# Patient Record
Sex: Male | Born: 1935 | Race: White | Hispanic: No | Marital: Married | Smoking: Former smoker
Health system: Southern US, Community
[De-identification: ages and names within clinical notes are randomized; demographics above are authoritative.]

## PROBLEM LIST (undated history)

## (undated) DIAGNOSIS — F329 Major depressive disorder, single episode, unspecified: Secondary | ICD-10-CM

## (undated) DIAGNOSIS — F32A Depression, unspecified: Secondary | ICD-10-CM

## (undated) DIAGNOSIS — T63331A Toxic effect of venom of brown recluse spider, accidental (unintentional), initial encounter: Secondary | ICD-10-CM

## (undated) DIAGNOSIS — G629 Polyneuropathy, unspecified: Secondary | ICD-10-CM

## (undated) DIAGNOSIS — Z9289 Personal history of other medical treatment: Secondary | ICD-10-CM

## (undated) DIAGNOSIS — D649 Anemia, unspecified: Secondary | ICD-10-CM

## (undated) DIAGNOSIS — K56699 Other intestinal obstruction unspecified as to partial versus complete obstruction: Secondary | ICD-10-CM

## (undated) DIAGNOSIS — C189 Malignant neoplasm of colon, unspecified: Secondary | ICD-10-CM

## (undated) HISTORY — DX: Toxic effect of venom of brown recluse spider, accidental (unintentional), initial encounter: T63.331A

## (undated) HISTORY — DX: Personal history of other medical treatment: Z92.89

## (undated) HISTORY — DX: Major depressive disorder, single episode, unspecified: F32.9

## (undated) HISTORY — DX: Malignant neoplasm of colon, unspecified: C18.9

## (undated) HISTORY — PX: CATARACT EXTRACTION: SUR2

## (undated) HISTORY — DX: Other intestinal obstruction unspecified as to partial versus complete obstruction: K56.699

## (undated) HISTORY — DX: Polyneuropathy, unspecified: G62.9

## (undated) HISTORY — DX: Depression, unspecified: F32.A

## (undated) HISTORY — DX: Anemia, unspecified: D64.9

---

## 1999-05-08 HISTORY — PX: PROSTATE SURGERY: SHX751

## 2011-07-18 DIAGNOSIS — L259 Unspecified contact dermatitis, unspecified cause: Secondary | ICD-10-CM | POA: Diagnosis not present

## 2011-11-26 DIAGNOSIS — R7989 Other specified abnormal findings of blood chemistry: Secondary | ICD-10-CM | POA: Diagnosis not present

## 2011-11-26 DIAGNOSIS — I69993 Ataxia following unspecified cerebrovascular disease: Secondary | ICD-10-CM | POA: Diagnosis not present

## 2011-11-26 DIAGNOSIS — M79609 Pain in unspecified limb: Secondary | ICD-10-CM | POA: Diagnosis not present

## 2011-11-26 DIAGNOSIS — I10 Essential (primary) hypertension: Secondary | ICD-10-CM | POA: Diagnosis not present

## 2011-11-26 DIAGNOSIS — R609 Edema, unspecified: Secondary | ICD-10-CM | POA: Diagnosis not present

## 2011-11-26 DIAGNOSIS — E785 Hyperlipidemia, unspecified: Secondary | ICD-10-CM | POA: Diagnosis not present

## 2011-11-26 DIAGNOSIS — E782 Mixed hyperlipidemia: Secondary | ICD-10-CM | POA: Diagnosis not present

## 2011-11-27 DIAGNOSIS — L280 Lichen simplex chronicus: Secondary | ICD-10-CM | POA: Diagnosis not present

## 2011-11-27 DIAGNOSIS — D485 Neoplasm of uncertain behavior of skin: Secondary | ICD-10-CM | POA: Diagnosis not present

## 2011-11-27 DIAGNOSIS — I8310 Varicose veins of unspecified lower extremity with inflammation: Secondary | ICD-10-CM | POA: Diagnosis not present

## 2012-08-14 DIAGNOSIS — R7989 Other specified abnormal findings of blood chemistry: Secondary | ICD-10-CM | POA: Diagnosis not present

## 2012-08-14 DIAGNOSIS — E785 Hyperlipidemia, unspecified: Secondary | ICD-10-CM | POA: Diagnosis not present

## 2012-08-14 DIAGNOSIS — R42 Dizziness and giddiness: Secondary | ICD-10-CM | POA: Diagnosis not present

## 2012-08-14 DIAGNOSIS — R5381 Other malaise: Secondary | ICD-10-CM | POA: Diagnosis not present

## 2012-08-14 DIAGNOSIS — R972 Elevated prostate specific antigen [PSA]: Secondary | ICD-10-CM | POA: Diagnosis not present

## 2012-08-14 DIAGNOSIS — E782 Mixed hyperlipidemia: Secondary | ICD-10-CM | POA: Diagnosis not present

## 2012-08-14 DIAGNOSIS — M7989 Other specified soft tissue disorders: Secondary | ICD-10-CM | POA: Diagnosis not present

## 2012-08-14 DIAGNOSIS — E559 Vitamin D deficiency, unspecified: Secondary | ICD-10-CM | POA: Diagnosis not present

## 2012-08-14 DIAGNOSIS — R609 Edema, unspecified: Secondary | ICD-10-CM | POA: Diagnosis not present

## 2012-08-14 DIAGNOSIS — N182 Chronic kidney disease, stage 2 (mild): Secondary | ICD-10-CM | POA: Diagnosis not present

## 2012-08-14 DIAGNOSIS — I10 Essential (primary) hypertension: Secondary | ICD-10-CM | POA: Diagnosis not present

## 2012-08-19 DIAGNOSIS — M25519 Pain in unspecified shoulder: Secondary | ICD-10-CM | POA: Diagnosis not present

## 2012-08-19 DIAGNOSIS — M25819 Other specified joint disorders, unspecified shoulder: Secondary | ICD-10-CM | POA: Diagnosis not present

## 2012-08-21 DIAGNOSIS — R42 Dizziness and giddiness: Secondary | ICD-10-CM | POA: Diagnosis not present

## 2012-08-21 DIAGNOSIS — R7309 Other abnormal glucose: Secondary | ICD-10-CM | POA: Diagnosis not present

## 2012-08-21 DIAGNOSIS — R269 Unspecified abnormalities of gait and mobility: Secondary | ICD-10-CM | POA: Diagnosis not present

## 2012-08-21 DIAGNOSIS — R6889 Other general symptoms and signs: Secondary | ICD-10-CM | POA: Diagnosis not present

## 2012-08-21 DIAGNOSIS — E039 Hypothyroidism, unspecified: Secondary | ICD-10-CM | POA: Diagnosis not present

## 2012-08-26 DIAGNOSIS — R42 Dizziness and giddiness: Secondary | ICD-10-CM | POA: Diagnosis not present

## 2012-08-26 DIAGNOSIS — Z6833 Body mass index (BMI) 33.0-33.9, adult: Secondary | ICD-10-CM | POA: Diagnosis not present

## 2012-10-05 DIAGNOSIS — E785 Hyperlipidemia, unspecified: Secondary | ICD-10-CM | POA: Diagnosis not present

## 2012-10-05 DIAGNOSIS — R51 Headache: Secondary | ICD-10-CM | POA: Diagnosis not present

## 2012-10-05 DIAGNOSIS — S139XXA Sprain of joints and ligaments of unspecified parts of neck, initial encounter: Secondary | ICD-10-CM | POA: Diagnosis not present

## 2012-10-05 DIAGNOSIS — S0100XA Unspecified open wound of scalp, initial encounter: Secondary | ICD-10-CM | POA: Diagnosis not present

## 2012-10-05 DIAGNOSIS — G609 Hereditary and idiopathic neuropathy, unspecified: Secondary | ICD-10-CM | POA: Diagnosis not present

## 2012-10-05 DIAGNOSIS — S0990XA Unspecified injury of head, initial encounter: Secondary | ICD-10-CM | POA: Diagnosis not present

## 2012-10-05 DIAGNOSIS — M542 Cervicalgia: Secondary | ICD-10-CM | POA: Diagnosis not present

## 2012-10-05 DIAGNOSIS — I10 Essential (primary) hypertension: Secondary | ICD-10-CM | POA: Diagnosis not present

## 2012-11-17 DIAGNOSIS — R1032 Left lower quadrant pain: Secondary | ICD-10-CM | POA: Diagnosis not present

## 2012-11-17 DIAGNOSIS — Z6833 Body mass index (BMI) 33.0-33.9, adult: Secondary | ICD-10-CM | POA: Diagnosis not present

## 2012-11-17 DIAGNOSIS — R197 Diarrhea, unspecified: Secondary | ICD-10-CM | POA: Diagnosis not present

## 2012-11-21 DIAGNOSIS — R1032 Left lower quadrant pain: Secondary | ICD-10-CM | POA: Diagnosis not present

## 2012-11-21 DIAGNOSIS — R197 Diarrhea, unspecified: Secondary | ICD-10-CM | POA: Diagnosis not present

## 2012-11-25 DIAGNOSIS — R197 Diarrhea, unspecified: Secondary | ICD-10-CM | POA: Diagnosis not present

## 2012-11-25 DIAGNOSIS — Z6833 Body mass index (BMI) 33.0-33.9, adult: Secondary | ICD-10-CM | POA: Diagnosis not present

## 2012-11-25 DIAGNOSIS — M549 Dorsalgia, unspecified: Secondary | ICD-10-CM | POA: Diagnosis not present

## 2013-03-02 DIAGNOSIS — R635 Abnormal weight gain: Secondary | ICD-10-CM | POA: Diagnosis not present

## 2013-03-02 DIAGNOSIS — Z6833 Body mass index (BMI) 33.0-33.9, adult: Secondary | ICD-10-CM | POA: Diagnosis not present

## 2013-03-02 DIAGNOSIS — E782 Mixed hyperlipidemia: Secondary | ICD-10-CM | POA: Diagnosis not present

## 2013-03-02 DIAGNOSIS — R94121 Abnormal vestibular function study: Secondary | ICD-10-CM | POA: Diagnosis not present

## 2013-12-10 DIAGNOSIS — E785 Hyperlipidemia, unspecified: Secondary | ICD-10-CM | POA: Diagnosis not present

## 2013-12-10 DIAGNOSIS — R7309 Other abnormal glucose: Secondary | ICD-10-CM | POA: Diagnosis not present

## 2013-12-10 DIAGNOSIS — E782 Mixed hyperlipidemia: Secondary | ICD-10-CM | POA: Diagnosis not present

## 2013-12-10 DIAGNOSIS — Z0000 Encounter for general adult medical examination without abnormal findings: Secondary | ICD-10-CM | POA: Diagnosis not present

## 2013-12-10 DIAGNOSIS — M7989 Other specified soft tissue disorders: Secondary | ICD-10-CM | POA: Diagnosis not present

## 2013-12-10 DIAGNOSIS — E669 Obesity, unspecified: Secondary | ICD-10-CM | POA: Diagnosis not present

## 2013-12-10 DIAGNOSIS — I10 Essential (primary) hypertension: Secondary | ICD-10-CM | POA: Diagnosis not present

## 2013-12-10 DIAGNOSIS — Z79899 Other long term (current) drug therapy: Secondary | ICD-10-CM | POA: Diagnosis not present

## 2014-06-10 DIAGNOSIS — R300 Dysuria: Secondary | ICD-10-CM | POA: Diagnosis not present

## 2014-06-10 DIAGNOSIS — K529 Noninfective gastroenteritis and colitis, unspecified: Secondary | ICD-10-CM | POA: Diagnosis not present

## 2014-06-11 DIAGNOSIS — K529 Noninfective gastroenteritis and colitis, unspecified: Secondary | ICD-10-CM | POA: Diagnosis not present

## 2014-06-11 DIAGNOSIS — R300 Dysuria: Secondary | ICD-10-CM | POA: Diagnosis not present

## 2014-06-17 DIAGNOSIS — R300 Dysuria: Secondary | ICD-10-CM | POA: Diagnosis not present

## 2014-06-17 DIAGNOSIS — K529 Noninfective gastroenteritis and colitis, unspecified: Secondary | ICD-10-CM | POA: Diagnosis not present

## 2014-06-17 DIAGNOSIS — R197 Diarrhea, unspecified: Secondary | ICD-10-CM | POA: Diagnosis not present

## 2014-09-01 DIAGNOSIS — R7989 Other specified abnormal findings of blood chemistry: Secondary | ICD-10-CM | POA: Diagnosis not present

## 2014-09-01 DIAGNOSIS — Z1389 Encounter for screening for other disorder: Secondary | ICD-10-CM | POA: Diagnosis not present

## 2014-09-01 DIAGNOSIS — G629 Polyneuropathy, unspecified: Secondary | ICD-10-CM | POA: Diagnosis not present

## 2014-09-01 DIAGNOSIS — R140 Abdominal distension (gaseous): Secondary | ICD-10-CM | POA: Diagnosis not present

## 2014-09-01 DIAGNOSIS — K529 Noninfective gastroenteritis and colitis, unspecified: Secondary | ICD-10-CM | POA: Diagnosis not present

## 2014-09-15 DIAGNOSIS — R140 Abdominal distension (gaseous): Secondary | ICD-10-CM | POA: Diagnosis not present

## 2014-11-02 DIAGNOSIS — L720 Epidermal cyst: Secondary | ICD-10-CM | POA: Diagnosis not present

## 2014-11-02 DIAGNOSIS — L219 Seborrheic dermatitis, unspecified: Secondary | ICD-10-CM | POA: Diagnosis not present

## 2015-06-07 DIAGNOSIS — K529 Noninfective gastroenteritis and colitis, unspecified: Secondary | ICD-10-CM | POA: Diagnosis not present

## 2015-06-07 DIAGNOSIS — K625 Hemorrhage of anus and rectum: Secondary | ICD-10-CM | POA: Diagnosis not present

## 2015-06-09 DIAGNOSIS — L309 Dermatitis, unspecified: Secondary | ICD-10-CM | POA: Diagnosis not present

## 2015-06-09 DIAGNOSIS — K529 Noninfective gastroenteritis and colitis, unspecified: Secondary | ICD-10-CM | POA: Diagnosis not present

## 2015-06-09 DIAGNOSIS — K625 Hemorrhage of anus and rectum: Secondary | ICD-10-CM | POA: Diagnosis not present

## 2015-06-15 DIAGNOSIS — L821 Other seborrheic keratosis: Secondary | ICD-10-CM | POA: Diagnosis not present

## 2015-06-15 DIAGNOSIS — L219 Seborrheic dermatitis, unspecified: Secondary | ICD-10-CM | POA: Diagnosis not present

## 2015-06-15 DIAGNOSIS — L304 Erythema intertrigo: Secondary | ICD-10-CM | POA: Diagnosis not present

## 2015-06-17 ENCOUNTER — Other Ambulatory Visit: Payer: Self-pay | Admitting: Physician Assistant

## 2015-06-17 DIAGNOSIS — R197 Diarrhea, unspecified: Secondary | ICD-10-CM | POA: Diagnosis not present

## 2015-06-17 DIAGNOSIS — K921 Melena: Secondary | ICD-10-CM

## 2015-06-17 DIAGNOSIS — K439 Ventral hernia without obstruction or gangrene: Secondary | ICD-10-CM | POA: Diagnosis not present

## 2015-06-17 DIAGNOSIS — R1084 Generalized abdominal pain: Secondary | ICD-10-CM

## 2015-06-17 DIAGNOSIS — D649 Anemia, unspecified: Secondary | ICD-10-CM | POA: Diagnosis not present

## 2015-06-17 DIAGNOSIS — R1032 Left lower quadrant pain: Secondary | ICD-10-CM | POA: Diagnosis not present

## 2015-06-17 DIAGNOSIS — K625 Hemorrhage of anus and rectum: Secondary | ICD-10-CM | POA: Diagnosis not present

## 2015-06-20 ENCOUNTER — Ambulatory Visit
Admission: RE | Admit: 2015-06-20 | Discharge: 2015-06-20 | Disposition: A | Discharge status: Home / Self Care | Payer: Medicare Other | Source: Ambulatory Visit | Attending: Physician Assistant | Admitting: Physician Assistant

## 2015-06-20 DIAGNOSIS — R1084 Generalized abdominal pain: Secondary | ICD-10-CM

## 2015-06-20 DIAGNOSIS — K921 Melena: Secondary | ICD-10-CM

## 2015-06-20 DIAGNOSIS — N200 Calculus of kidney: Secondary | ICD-10-CM | POA: Diagnosis not present

## 2015-06-20 DIAGNOSIS — R197 Diarrhea, unspecified: Secondary | ICD-10-CM

## 2015-06-20 MED ORDER — IOPAMIDOL (ISOVUE-300) INJECTION 61%
125.0000 mL | Freq: Once | INTRAVENOUS | Status: AC | PRN
  Administered 2015-06-20: 125 mL via INTRAVENOUS

## 2015-06-21 DIAGNOSIS — R935 Abnormal findings on diagnostic imaging of other abdominal regions, including retroperitoneum: Secondary | ICD-10-CM | POA: Diagnosis not present

## 2015-06-21 DIAGNOSIS — K921 Melena: Secondary | ICD-10-CM | POA: Diagnosis not present

## 2015-08-23 ENCOUNTER — Telehealth: Payer: Self-pay | Admitting: Internal Medicine

## 2015-08-23 NOTE — Telephone Encounter (Signed)
4/18  Records received - pt is requesting a 2nd opinion from Dr. Carlean Purl; routed records to be reviewed.

## 2015-09-06 ENCOUNTER — Encounter: Payer: Self-pay | Admitting: Internal Medicine

## 2015-09-09 ENCOUNTER — Ambulatory Visit (INDEPENDENT_AMBULATORY_CARE_PROVIDER_SITE_OTHER): Payer: Medicare Other | Admitting: Internal Medicine

## 2015-09-09 ENCOUNTER — Encounter: Payer: Self-pay | Admitting: Internal Medicine

## 2015-09-09 VITALS — BP 130/54 | HR 61 | Ht 65.5 in | Wt 197.0 lb

## 2015-09-09 DIAGNOSIS — R933 Abnormal findings on diagnostic imaging of other parts of digestive tract: Secondary | ICD-10-CM

## 2015-09-09 DIAGNOSIS — K625 Hemorrhage of anus and rectum: Secondary | ICD-10-CM

## 2015-09-09 DIAGNOSIS — D509 Iron deficiency anemia, unspecified: Secondary | ICD-10-CM

## 2015-09-09 NOTE — Progress Notes (Signed)
Subjective:    Patient ID: Tommy Rivera, male    DOB: [birth year], 82 y.o.   MRN: [id number] Cc: rectal bleeding, ? Sigmoid mass on CT HPI 82 yo wm here with wife - has had loose stools, diarrhea and intermittent rectal bleeding since early this year. Ended up with labs showing iron-deficiency anemia, suspected distal sigmoid mass on CT scan. Saw Dr. Paulita Fujita and colonoscopy was recommended but patient and wife very reluctant to do that - so asked for another opinion - transfer of care. Has been treated with hydrocortisone topical no help and loperamide +/- help.  No Known Allergies Outpatient Prescriptions Prior to Visit  Medication Sig Dispense Refill  . ferrous sulfate 325 (65 FE) MG tablet Take 325 mg by mouth 3 (three) times daily with meals.    . gabapentin (NEURONTIN) 600 MG tablet Take 600 mg by mouth 3 (three) times daily.    Marland Kitchen loperamide (IMODIUM A-D) 2 MG tablet Take 2 mg by mouth. Take 1 tablet once or twice a day as needed     No facility-administered medications prior to visit.   Past Medical History  Diagnosis Date  . Neuropathy ([place])   . Depression   . History of blood transfusion   . Anemia   . Brown recluse spider bite    Past Surgical History  Procedure Laterality Date  . Prostate surgery  2001   Social History   Social History  . Marital Status: Married    Spouse Name: N/A  . Number of Children: 2  . Years of Education: N/A   Social History Main Topics  . Smoking status: Former Smoker    Types: Cigarettes    Quit date: 05/07/1964  . Smokeless tobacco: None  . Alcohol Use: 0.0 oz/week    0 Standard drinks or equivalent per week     Comment: occ.  . Drug Use: None  . Sexual Activity: Not Asked   Other Topics Concern  . None   Social History Narrative   Married - retired [employer] [place] here to [place] [employer] 2015-16   Has a daughter in [place]   Family History  Problem Relation Age of Onset  . Heart defect Mother   . Breast cancer  Daughter     Review of Systems Slow ambulation, uses cane, still drives, has worsening bilateral LE edema all other ROS negative    Objective:   Physical Exam @BP  130/54 mmHg  Pulse 61  Ht 5' 5.5" (1.664 m)  Wt 197 lb (89.359 kg)  BMI 32.27 kg/m2@  General:  Well-developed, well-nourished and in no acute distress Eyes:  anicteric. ENT:   Mouth and posterior pharynx free of lesions.  Neck:   supple w/o thyromegaly or mass.  Lungs: Clear to auscultation bilaterally. Heart:  S1S2, no rubs, murmurs, gallops. Abdomen:  protuberant soft, non-tender, no hepatosplenomegaly, hernia, or mass and BS+.  Rectal: deferred Lymph:  no cervical or supraclavicular adenopathy. Extremities:   2+ bilat LE  edema, cyanosis or clubbing Skin   no rash. Neuro:  A&O x 3.  Psych:  appropriate mood and  Affect.   Data Reviewed: As per HPI [facility] P[facility] notes, GI notes and CT scan images and reports reviewed w/ patient and wife Hgb 11. 6 and ferritin 17.12 Jun 2015     Assessment & Plan:   Encounter Diagnoses  Name Primary?  . Abnormal CT scan, colon Yes  . Rectal bleeding   . Anemia, iron  deficiency    I think flex sig is fine to evaluate this lesion and full colonoscopy not needed especially at his age. The risks and benefits as well as alternatives of endoscopic procedure(s) have been discussed and reviewed. All questions answered. He is at higher rsk of problems from endoscopy but think diagnosis is important here then can determine next steps if he does have cancer which  I am concerned is likely.The patient agrees to proceed.   Cc: Dr. Olena Mater

## 2015-09-09 NOTE — Patient Instructions (Addendum)
  You have been scheduled for a flexible sigmoidoscopy. Please follow the written instructions given to you at your visit today. If you use inhalers (even only as needed), please bring them with you on the day of your procedure.     I appreciate the opportunity to care for you.     P.S.-- Patient does not have to use the magnesium citrate per Dr Carlean Purl to prep.

## 2015-09-15 ENCOUNTER — Encounter: Payer: Self-pay | Admitting: Internal Medicine

## 2015-09-15 ENCOUNTER — Ambulatory Visit (AMBULATORY_SURGERY_CENTER): Payer: Medicare Other | Admitting: Internal Medicine

## 2015-09-15 VITALS — BP 111/60 | HR 67 | Temp 99.8°F | Resp 14 | Ht 65.0 in | Wt 197.0 lb

## 2015-09-15 DIAGNOSIS — R933 Abnormal findings on diagnostic imaging of other parts of digestive tract: Secondary | ICD-10-CM | POA: Diagnosis not present

## 2015-09-15 DIAGNOSIS — Z1211 Encounter for screening for malignant neoplasm of colon: Secondary | ICD-10-CM | POA: Diagnosis not present

## 2015-09-15 DIAGNOSIS — K6289 Other specified diseases of anus and rectum: Secondary | ICD-10-CM

## 2015-09-15 DIAGNOSIS — K625 Hemorrhage of anus and rectum: Secondary | ICD-10-CM | POA: Diagnosis not present

## 2015-09-15 DIAGNOSIS — C186 Malignant neoplasm of descending colon: Secondary | ICD-10-CM | POA: Insufficient documentation

## 2015-09-15 DIAGNOSIS — C19 Malignant neoplasm of rectosigmoid junction: Secondary | ICD-10-CM | POA: Diagnosis not present

## 2015-09-15 MED ORDER — SODIUM CHLORIDE 0.9 % IV SOLN: 500.0000 mL | INTRAVENOUS | Status: DC

## 2015-09-15 NOTE — Progress Notes (Signed)
Called to room to assist during endoscopic procedure.  Patient ID and intended procedure confirmed with present staff. Received instructions for my participation in the procedure from the performing physician.  

## 2015-09-15 NOTE — Op Note (Signed)
[place] Patient Name: Tommy Rivera Procedure Date: 09/15/2015 10:00 AM MRN: [id number] Endoscopist: Gatha Mayer , MD Age: 82 Date of Birth: 02-14-1922 Gender: Male Procedure:                Flexible Sigmoidoscopy Indications:              Abnormal CT of the GI tract Medicines:                Propofol per Anesthesia, Monitored Anesthesia Care Procedure:                Pre-Anesthesia Assessment:                           - Prior to the procedure, a History and Physical                            was performed, and patient medications and                            allergies were reviewed. The patient's tolerance of                            previous anesthesia was also reviewed. The risks                            and benefits of the procedure and the sedation                            options and risks were discussed with the patient.                            All questions were answered, and informed consent                            was obtained. Prior Anticoagulants: The patient has                            taken no previous anticoagulant or antiplatelet                            agents. ASA Grade Assessment: II - A patient with                            mild systemic disease. After reviewing the risks                            and benefits, the patient was deemed in                            satisfactory condition to undergo the procedure.                           After obtaining informed consent, the scope was  passed under direct vision. The Model PCF-H190DL                            [id number]) scope was introduced through the anus                            and advanced to the the rectosigmoid junction. The                            flexible sigmoidoscopy was accomplished without                            difficulty. The patient tolerated the procedure                            well. The quality of the bowel preparation  was                            adequate. Scope In: Scope Out: Findings:                 The perianal and digital rectal examinations were                            normal.                           A fungating, infiltrative, polypoid and ulcerated                            partially obstructing large mass was found in the                            recto-sigmoid colon. The mass was circumferential.                            The mass measured at least 5 cm long - 17-22 cm                            (depth of insertion - gastroscope would not go far                            cm in length. No bleeding was present. This was                            biopsied with a cold forceps for histology.                            Verification of patient identification for the                            specimen was done. Estimated blood loss was                            minimal.  Area was successfully injected with 3 mL                            Spot (carbon black) for tattooing. Distal aspect                            (NL tissue) Estimated blood loss: none.                           The exam was otherwise without abnormality.                           retroflexion performed in rectum Complications:            No immediate complications. Estimated Blood Loss:     Estimated blood loss was minimal. Impression:               - Malignant partially obstructing tumor in the                            recto-sigmoid colon. Biopsied.                           - The examination was otherwise normal. Recommendation:           - Discharge patient to home.                           - Patient has a contact number available for                            emergencies. The signs and symptoms of potential                            delayed complications were discussed with the                            patient. Return to normal activities tomorrow.                            Written discharge instructions were  provided to the                            patient.                           - Low fiber diet indefinitely. Gatha Mayer, MD 09/15/2015 10:49:32 AM This report has been signed electronically. CC Letter to:             Angelina Pih

## 2015-09-15 NOTE — Progress Notes (Signed)
A and Ox 3 Report to RN 

## 2015-09-15 NOTE — Patient Instructions (Addendum)
There is a mass that looks like a cancer - I took biopsies and will call with results.  Please stay on a low fiber diet.  I appreciate the opportunity to care for you. Gatha Mayer, MD, FACG  YOU HAD AN ENDOSCOPIC PROCEDURE TODAY AT [facility]:   Refer to the procedure report that was given to you for any specific questions about what was found during the examination.  If the procedure report does not answer your questions, please call your gastroenterologist to clarify.  If you requested that your care partner not be given the details of your procedure findings, then the procedure report has been included in a sealed envelope for you to review at your convenience later.  YOU SHOULD EXPECT: Some feelings of bloating in the abdomen. Passage of more gas than usual.  Walking can help get rid of the air that was put into your GI tract during the procedure and reduce the bloating. If you had a lower endoscopy (such as a colonoscopy or flexible sigmoidoscopy) you may notice spotting of blood in your stool or on the toilet paper. If you underwent a bowel prep for your procedure, you may not have a normal bowel movement for a few days.  Please Note:  You might notice some irritation and congestion in your nose or some drainage.  This is from the oxygen used during your procedure.  There is no need for concern and it should clear up in a day or so.  SYMPTOMS TO REPORT IMMEDIATELY:   Following lower endoscopy (colonoscopy or flexible sigmoidoscopy):  Excessive amounts of blood in the stool  Significant tenderness or worsening of abdominal pains  Swelling of the abdomen that is new, acute  Fever of 100F or higher  For urgent or emergent issues, a gastroenterologist can be reached at any hour by calling [phone].   DIET: Your first meal following the procedure should be a small meal and then it is ok to progress to your normal diet. Heavy or fried foods are harder to  digest and may make you feel nauseous or bloated.  Likewise, meals heavy in dairy and vegetables can increase bloating.  Drink plenty of fluids but you should avoid alcoholic beverages for 24 hours.  ACTIVITY:  You should plan to take it easy for the rest of today and you should NOT DRIVE or use heavy machinery until tomorrow (because of the sedation medicines used during the test).    FOLLOW UP: Our staff will call the number listed on your records the next business day following your procedure to check on you and address any questions or concerns that you may have regarding the information given to you following your procedure. If we do not reach you, we will leave a message.  However, if you are feeling well and you are not experiencing any problems, there is no need to return our call.  We will assume that you have returned to your regular daily activities without incident.  If any biopsies were taken you will be contacted by phone or by letter within the next 1-3 weeks.  Please call us at [phone] if you have not heard about the biopsies in 3 weeks.    SIGNATURES/CONFIDENTIALITY: You and/or your care partner have signed paperwork which will be entered into your electronic medical record.  These signatures attest to the fact that that the information above on your After Visit Summary has been reviewed and is understood.  Full responsibility of the confidentiality of this discharge information lies with you and/or your care-partner.  Low fiber diet handout provided to patient/care partner.

## 2015-09-16 ENCOUNTER — Telehealth: Payer: Self-pay | Admitting: *Deleted

## 2015-09-16 NOTE — Telephone Encounter (Signed)
  Follow up Call-  Call back number 09/15/2015  Post procedure Call Back phone  # [phone]  Permission to leave phone message Yes     Patient questions:  Do you have a fever, pain , or abdominal swelling? No. Pain Score  0 *  Have you tolerated food without any problems? Yes.    Have you been able to return to your normal activities? Yes.    Do you have any questions about your discharge instructions: Diet   No. Medications  No. Follow up visit  No.  Do you have questions or concerns about your Care? No.  Actions: * If pain score is 4 or above: No action needed, pain <4.  Patient's wife took call.  States they are waiting to hear from Dr. Carlean Purl re: biopsy results today.  Encouraged wife to let Pt. Know that he was contacted for follow-up and to call back if any questions.

## 2015-09-18 NOTE — Progress Notes (Signed)
Quick Note:  Was unable to connect w/ him 5/12 Will call again 5/15 He needs labs and possible surgical referral - sigmoid cancer - partial ureteral obstruction ______

## 2015-09-20 ENCOUNTER — Telehealth: Payer: Self-pay | Admitting: Internal Medicine

## 2015-09-20 NOTE — Telephone Encounter (Signed)
Spoke to wife See path report

## 2015-09-20 NOTE — Progress Notes (Signed)
Quick Note:  Messages left w/ pt and daughter to call back ______

## 2015-09-20 NOTE — Progress Notes (Signed)
Quick Note:  Spoke to wife that bx confirmed sigmoid cancer He is willing to see what Tx options there are - told her I am concerned about obstruction in next few months  1) Appt Dr. Marcello Moores [facility] please 2) Appt Dr. Benay Spice or [facility] - ccing Merceda Elks for help 3) I can present at cancer conference 5/24 0r 5/31  4) needs CT chest w/ contrat, CBC, CMET CEA(did not mention to wife) - dx sigmoid colon cancer 5) [facility] - no letter/recall ______

## 2015-09-21 ENCOUNTER — Other Ambulatory Visit: Payer: Self-pay

## 2015-09-21 DIAGNOSIS — C187 Malignant neoplasm of sigmoid colon: Secondary | ICD-10-CM

## 2015-09-22 ENCOUNTER — Other Ambulatory Visit (INDEPENDENT_AMBULATORY_CARE_PROVIDER_SITE_OTHER): Payer: Medicare Other

## 2015-09-22 DIAGNOSIS — C187 Malignant neoplasm of sigmoid colon: Secondary | ICD-10-CM

## 2015-09-22 LAB — CBC WITH DIFFERENTIAL/PLATELET
BASOS PCT: 0.6 % (ref 0.0–3.0)
Basophils Absolute: 0.1 10*3/uL (ref 0.0–0.1)
EOS PCT: 9 % — AB (ref 0.0–5.0)
Eosinophils Absolute: 0.9 10*3/uL — ABNORMAL HIGH (ref 0.0–0.7)
HEMATOCRIT: 35.8 % — AB (ref 39.0–52.0)
HEMOGLOBIN: 11.8 g/dL — AB (ref 13.0–17.0)
LYMPHS PCT: 10.3 % — AB (ref 12.0–46.0)
Lymphs Abs: 1 10*3/uL (ref 0.7–4.0)
MCHC: 32.9 g/dL (ref 30.0–36.0)
MCV: 86.1 fl (ref 78.0–100.0)
MONOS PCT: 7.9 % (ref 3.0–12.0)
Monocytes Absolute: 0.8 10*3/uL (ref 0.1–1.0)
NEUTROS ABS: 7.2 10*3/uL (ref 1.4–7.7)
Neutrophils Relative %: 72.2 % (ref 43.0–77.0)
PLATELETS: 326 10*3/uL (ref 150.0–400.0)
RBC: 4.16 Mil/uL — ABNORMAL LOW (ref 4.22–5.81)
RDW: 15.9 % — ABNORMAL HIGH (ref 11.5–15.5)
WBC: 10 10*3/uL (ref 4.0–10.5)

## 2015-09-22 LAB — COMPREHENSIVE METABOLIC PANEL
ALBUMIN: 3.6 g/dL (ref 3.5–5.2)
ALT: 5 U/L (ref 0–53)
AST: 7 U/L (ref 0–37)
Alkaline Phosphatase: 78 U/L (ref 39–117)
BILIRUBIN TOTAL: 0.4 mg/dL (ref 0.2–1.2)
BUN: 24 mg/dL — ABNORMAL HIGH (ref 6–23)
CALCIUM: 9.1 mg/dL (ref 8.4–10.5)
CHLORIDE: 106 meq/L (ref 96–112)
CO2: 28 meq/L (ref 19–32)
CREATININE: 1.69 mg/dL — AB (ref 0.40–1.50)
GFR: 40.53 mL/min — AB (ref >60.00)
Glucose, Bld: 84 mg/dL (ref 70–99)
Potassium: 4.9 mEq/L (ref 3.5–5.1)
Sodium: 140 mEq/L (ref 135–145)
Total Protein: 6.4 g/dL (ref 6.0–8.3)

## 2015-09-23 ENCOUNTER — Ambulatory Visit (INDEPENDENT_AMBULATORY_CARE_PROVIDER_SITE_OTHER)
Admission: RE | Admit: 2015-09-23 | Discharge: 2015-09-23 | Disposition: A | Discharge status: Home / Self Care | Payer: Medicare Other | Source: Ambulatory Visit | Attending: Internal Medicine | Admitting: Internal Medicine

## 2015-09-23 DIAGNOSIS — C187 Malignant neoplasm of sigmoid colon: Secondary | ICD-10-CM

## 2015-09-23 DIAGNOSIS — C189 Malignant neoplasm of colon, unspecified: Secondary | ICD-10-CM | POA: Diagnosis not present

## 2015-09-23 LAB — CEA: CEA: <0.5 ng/mL

## 2015-09-23 MED ORDER — IOPAMIDOL (ISOVUE-300) INJECTION 61%
65.0000 mL | Freq: Once | INTRAVENOUS | Status: AC | PRN
  Administered 2015-09-23: 65 mL via INTRAVENOUS

## 2015-09-26 ENCOUNTER — Encounter: Payer: Self-pay | Admitting: *Deleted

## 2015-09-26 ENCOUNTER — Telehealth: Payer: Self-pay | Admitting: *Deleted

## 2015-09-26 NOTE — Telephone Encounter (Signed)
  Oncology Nurse Navigator Documentation  Navigator Location: [facility] (09/26/15 1348) Navigator Encounter Type: Introductory phone call (09/26/15 1348)   Abnormal Finding Date: 06/20/15 (09/26/15 1348) Confirmed Diagnosis Date: 09/15/15 (09/26/15 1348)     Spoke with wife, Tommy Rivera and provided new patient appointment for 10/06/15 at 11:00 with Dr. Truitt Merle. Informed of location of [place], valet service, and registration process. Reminded to bring insurance cards and a current medication list, including supplements. Wife verbalizes understanding.

## 2015-09-26 NOTE — Progress Notes (Signed)
Quick Note:  Let wife or patient know labs ok overall with slight impairment of kidney function only CT chest seems ok - ? Of an enlarged lymph node which does not prove anything bad Stick with plans to see Dr. Marcello Moores He may also need to see a urologist - given that the tumor seems to be pressing on the ureter though will see what Dr. Marcello Moores thinks about that - I will ask her if we should go ahead with a referral ______

## 2015-09-28 ENCOUNTER — Encounter: Payer: Self-pay | Admitting: Internal Medicine

## 2015-09-28 NOTE — Progress Notes (Signed)
Quick Note:  Please explain to patient that we reviewed his case in GI cancer conference today - since the tumor is partially compressing the ureter we need him to see a urologist Please schedule with Dr. Tresa Moore - Dr. Marcello Moores discussed case with him  Reason is partial ureteral obstruction ______

## 2015-09-30 DIAGNOSIS — C187 Malignant neoplasm of sigmoid colon: Secondary | ICD-10-CM | POA: Diagnosis not present

## 2015-10-05 ENCOUNTER — Encounter: Payer: Self-pay | Admitting: *Deleted

## 2015-10-06 ENCOUNTER — Other Ambulatory Visit (HOSPITAL_BASED_OUTPATIENT_CLINIC_OR_DEPARTMENT_OTHER): Payer: Medicare Other

## 2015-10-06 ENCOUNTER — Ambulatory Visit (HOSPITAL_BASED_OUTPATIENT_CLINIC_OR_DEPARTMENT_OTHER): Payer: Medicare Other | Admitting: Hematology

## 2015-10-06 ENCOUNTER — Telehealth: Payer: Self-pay | Admitting: Hematology

## 2015-10-06 ENCOUNTER — Encounter: Payer: Self-pay | Admitting: *Deleted

## 2015-10-06 ENCOUNTER — Encounter: Payer: Self-pay | Admitting: Hematology

## 2015-10-06 VITALS — BP 154/48 | HR 71 | Temp 98.6°F | Resp 18 | Ht 65.0 in | Wt 190.7 lb

## 2015-10-06 DIAGNOSIS — N189 Chronic kidney disease, unspecified: Secondary | ICD-10-CM | POA: Diagnosis not present

## 2015-10-06 DIAGNOSIS — C186 Malignant neoplasm of descending colon: Secondary | ICD-10-CM

## 2015-10-06 DIAGNOSIS — C19 Malignant neoplasm of rectosigmoid junction: Secondary | ICD-10-CM

## 2015-10-06 DIAGNOSIS — D649 Anemia, unspecified: Secondary | ICD-10-CM | POA: Diagnosis not present

## 2015-10-06 LAB — COMPREHENSIVE METABOLIC PANEL
AST: 9 U/L (ref 5–34)
Albumin: 3.3 g/dL — ABNORMAL LOW (ref 3.5–5.0)
Alkaline Phosphatase: 87 U/L (ref 40–150)
Anion Gap: 5 mEq/L (ref 3–11)
BUN: 26.5 mg/dL — AB (ref 7.0–26.0)
CALCIUM: 9.2 mg/dL (ref 8.4–10.4)
CHLORIDE: 107 meq/L (ref 98–109)
CO2: 28 mEq/L (ref 22–29)
CREATININE: 1.7 mg/dL — AB (ref 0.7–1.3)
EGFR: 34 mL/min/{1.73_m2} — ABNORMAL LOW (ref >90)
Glucose: 89 mg/dl (ref 70–140)
Potassium: 4.8 mEq/L (ref 3.5–5.1)
Sodium: 140 mEq/L (ref 136–145)
Total Bilirubin: 0.5 mg/dL (ref 0.20–1.20)
Total Protein: 6.9 g/dL (ref 6.4–8.3)

## 2015-10-06 LAB — CBC WITH DIFFERENTIAL/PLATELET
BASO%: 0.9 % (ref 0.0–2.0)
Basophils Absolute: 0.1 10*3/uL (ref 0.0–0.1)
EOS%: 7.2 % — AB (ref 0.0–7.0)
Eosinophils Absolute: 0.6 10*3/uL — ABNORMAL HIGH (ref 0.0–0.5)
HEMATOCRIT: 37.4 % — AB (ref 38.4–49.9)
HEMOGLOBIN: 11.9 g/dL — AB (ref 13.0–17.1)
LYMPH#: 0.9 10*3/uL (ref 0.9–3.3)
LYMPH%: 10.3 % — ABNORMAL LOW (ref 14.0–49.0)
MCH: 28.1 pg (ref 27.2–33.4)
MCHC: 31.9 g/dL — AB (ref 32.0–36.0)
MCV: 88.2 fL (ref 79.3–98.0)
MONO#: 0.8 10*3/uL (ref 0.1–0.9)
MONO%: 9.4 % (ref 0.0–14.0)
NEUT#: 6.4 10*3/uL (ref 1.5–6.5)
NEUT%: 72.2 % (ref 39.0–75.0)
Platelets: 282 10*3/uL (ref 140–400)
RBC: 4.25 10*6/uL (ref 4.20–5.82)
RDW: 15.9 % — AB (ref 11.0–14.6)
WBC: 8.9 10*3/uL (ref 4.0–10.3)

## 2015-10-06 LAB — IRON AND TIBC
%SAT: 12 % — AB (ref 20–55)
IRON: 30 ug/dL — AB (ref 42–163)
TIBC: 254 ug/dL (ref 202–409)
UIBC: 224 ug/dL (ref 117–376)

## 2015-10-06 LAB — FERRITIN: FERRITIN: 43 ng/mL (ref 22–316)

## 2015-10-06 NOTE — Progress Notes (Signed)
Oncology Nurse Navigator Documentation  Oncology Nurse Navigator Flowsheets 10/06/2015  Navigator Location [facility]  Navigator Encounter Type Initial MedOnc  Abnormal Finding Date -  Confirmed Diagnosis Date -  Patient Visit Type MedOnc;Initial  Treatment Phase Abnormal Scans  Barriers/Navigation Needs Education;Coordination of Care  Education Understanding Cancer/ Treatment Options;Coping with Diagnosis/ Prognosis;Newly Diagnosed Cancer Education  Interventions Coordination of Care;Education Method  Coordination of Care Appts--GI  Education Method Verbal;Written;Teach-back  Support Groups/Services GI Support Group;Other-[employer] development officer. Patient declined ACS information  Acuity Level 2  Time Spent with Patient 7  Met with patient, wife and daughter during new patient visit. Explained the role of the GI Nurse Navigator and provided New Patient Packet with information on: 1. Colon cancer--CEA marker information provided 2. Support groups 3. Advanced Directives 4. Fall Safety Plan Answered questions, reviewed current treatment plan using TEACH back and provided emotional support. Provided copy of current treatment plan. Callan reports having loose stools at about 3-4 times/day. He takes Imodium with success. He is eating well and having no pain. He still drives and goes to grocery store and [place] on occasion. He enjoys reading and watching TV. Ambulates with cane or walking stick. Will reach out to GI and make them aware he is interested in colonic stent. The have an appointment with [facility] next week. Had patient sign a ROI to get records from urology when available.   Merceda Elks, RN, BSN GI Oncology [place]

## 2015-10-06 NOTE — Progress Notes (Signed)
[place]  [phone] Fax:([phone]) [place] Note   Patient Care Team: Angelina Pih, MD as PCP - General (Family Medicine) 10/06/2015  CHIEF COMPLAINTS/PURPOSE OF CONSULTATION:  Newly diagnosed colon cancer     Cancer of left colon ([place])   06/20/2015 Imaging CT ABD/PELVIS: colonic neoplasm rectosigmoid with partial obstructin right ureter   09/15/2015 Pathology Results Adenocarcinoma   09/15/2015 Procedure SIGMOIDOSCOPY: Partially obstructing mass at rectosigmoid colon;circumferential; at least 5 cm long;17-22 cm depth of invasion   09/22/2015 Tumor Marker CEA= <0.5   09/23/2015 Imaging CT CHEST: 1.8 cm subcarinal mediastinal lymph node-nonspecific   10/05/2015 Initial Diagnosis Colon cancer ([place])    HISTORY OF PRESENTING ILLNESS:  Tommy Rivera 82 y.o. male is here because of his newly diagnosed colon cancer. He is accompanied by his wife and daughter to the clinic today.   He has had chronic diarrhea for 4 years, and episodic rectal bleeding for 4-6 months, he only had 2-3 episodes of bleeding, which resolved on it's own, no pain , nausea, or weight loss  He was seen by PCP and referred to GI Dr. Paulita Fujita. Colonoscopy was discussed and the patient declined. He then saw Dr. Carlean Purl for second opinion, who recommended flexible sigmoidoscopy. Patient agreed and underwent the procedure on 09/15/2015, which showed a partially obstructing mass at the rectosigmoid colon, circumferential, at least 5 cm long. Biopsies showed adenocarcinoma. He was referred to see colorectal surgeon Dr. Marcello Moores, surgery was offered, but the patient and his family members are very concerned about the risk of surgery due to his advanced age. They would like to know the other treatment options.  He lives with his wife, is able to take care of his ADLs, but does not do much else. He used to drive and does yard work up to a year ago, much less active lately. He is very hard of  hearing.  MEDICAL HISTORY:  Past Medical History  Diagnosis Date  . Neuropathy ([place])   . Depression   . History of blood transfusion   . Anemia   . Brown recluse spider bite     SURGICAL HISTORY: Past Surgical History  Procedure Laterality Date  . Prostate surgery  2001  . Cataract extraction      SOCIAL HISTORY: Social History   Social History  . Marital Status: Married    Spouse Name: N/A  . Number of Children: 2  . Years of Education: N/A   Occupational History  . Not on file.   Social History Main Topics  . Smoking status: Former Smoker    Types: Cigarettes    Quit date: 05/07/1964  . Smokeless tobacco: Not on file  . Alcohol Use: 0.0 oz/week    0 Standard drinks or equivalent per week     Comment: occ.  . Drug Use: Not on file  . Sexual Activity: Not on file   Other Topics Concern  . Not on file   Social History Narrative   Married - retired [employer] [place] here to [place] 2015-16   Has a daughter in [place]: Family History  Problem Relation Age of Onset  . Heart defect Mother   . Breast cancer Daughter     ALLERGIES:  has No Known Allergies.  MEDICATIONS:  Current Outpatient Prescriptions  Medication Sig Dispense Refill  . gabapentin (NEURONTIN) 600 MG tablet Take 600 mg by mouth 3 (three) times daily.    Marland Kitchen  loperamide (IMODIUM A-D) 2 MG tablet Take 2 mg by mouth. Take 1 tablet once or twice a day as needed     No current facility-administered medications for this visit.    REVIEW OF SYSTEMS:   Constitutional: Denies fevers, chills or abnormal night sweats Eyes: Denies blurriness of vision, double vision or watery eyes Ears, nose, mouth, throat, and face: Denies mucositis or sore throat Respiratory: Denies cough, dyspnea or wheezes Cardiovascular: Denies palpitation, chest discomfort or lower extremity swelling Gastrointestinal:  Denies nausea, heartburn or change in bowel habits Skin: Denies abnormal  skin rashes Lymphatics: Denies new lymphadenopathy or easy bruising Neurological:Denies numbness, tingling or new weaknesses Behavioral/Psych: Mood is stable, no new changes  All other systems were reviewed with the patient and are negative.  PHYSICAL EXAMINATION: ECOG PERFORMANCE STATUS: 2 - Symptomatic, <50% confined to bed  Filed Vitals:   10/06/15 1104  BP: 154/48  Pulse: 71  Temp: 98.6 F (37 C)  Resp: 18   Filed Weights   10/06/15 1104  Weight: 190 lb 11.2 oz (86.501 kg)    GENERAL:alert, no distress and comfortable SKIN: skin color, texture, turgor are normal, no rashes or significant lesions EYES: normal, conjunctiva are pink and non-injected, sclera clear OROPHARYNX:no exudate, no erythema and lips, buccal mucosa, and tongue normal  NECK: supple, thyroid normal size, non-tender, without nodularity LYMPH:  no palpable lymphadenopathy in the cervical, axillary or inguinal LUNGS: clear to auscultation and percussion with normal breathing effort HEART: regular rate & rhythm and no murmurs and no lower extremity edema ABDOMEN:abdomen soft, non-tender and normal bowel sounds Musculoskeletal:no cyanosis of digits and no clubbing  PSYCH: alert & oriented x 3 with fluent speech NEURO: no focal motor/sensory deficits  LABORATORY DATA:  I have reviewed the data as listed CBC Latest Ref Rng 09/22/2015  WBC 4.0 - 10.5 K/uL 10.0  Hemoglobin 13.0 - 17.0 g/dL 11.8(L)  Hematocrit 39.0 - 52.0 % 35.8(L)  Platelets 150.0 - 400.0 K/uL 326.0   CMP Latest Ref Rng 09/22/2015  Glucose 70 - 99 mg/dL 84  BUN 6 - 23 mg/dL 24(H)  Creatinine 0.40 - 1.50 mg/dL 1.69(H)  Sodium 135 - 145 mEq/L 140  Potassium 3.5 - 5.1 mEq/L 4.9  Chloride 96 - 112 mEq/L 106  CO2 19 - 32 mEq/L 28  Calcium 8.4 - 10.5 mg/dL 9.1  Total Protein 6.0 - 8.3 g/dL 6.4  Total Bilirubin 0.2 - 1.2 mg/dL 0.4  Alkaline Phos 39 - 117 U/L 78  AST 0 - 37 U/L 7  ALT 0 - 53 U/L 5   PATHOLOGY REPORT  Colon, biopsy,  recto-sigmoid mass 09/15/2015 ADENOCARCINOMA Microscopic Comment Diagnosis of malignancy has been confirmed with Dr. Lyndon Code.  RADIOGRAPHIC STUDIES: I have personally reviewed the radiological images as listed and agreed with the findings in the report. Ct Chest W Contrast  09/23/2015  CLINICAL DATA:  Newly diagnosed sigmoid colon carcinoma.  Staging. EXAM: CT CHEST WITH CONTRAST TECHNIQUE: Multidetector CT imaging of the chest was performed during intravenous contrast administration. CONTRAST:  32m ISOVUE-300 IOPAMIDOL (ISOVUE-300) INJECTION 61% COMPARISON:  None. FINDINGS: Mediastinum/Lymph Nodes: Subcarinal mediastinal lymph node seen measure 1.8 cm on image 78/series 2. No other pathologically enlarged lymph nodes identified within the thorax. Normal heart size. Coronary artery calcification noted. Aortic atherosclerotic calcification noted. Lungs/Pleura: No pulmonary mass, infiltrate, or effusion. Upper abdomen: No acute findings. Musculoskeletal: No chest wall mass or suspicious bone lesions identified. IMPRESSION: No definite evidence of metastatic disease within the thorax. 1.8 cm subcarinal  mediastinal lymph node, which is nonspecific. Recommend attention on follow-up imaging, or consider PET-CT for further evaluation. Electronically Signed   By: Earle Gell M.D.   On: 09/23/2015 16:29   Ct ABDOMEN AND PELVIS W CONTRAST 06/20/2015 IMPRESSION: Changes consistent with a colonic neoplasm at the rectosigmoid and till proven otherwise. Direct visualization is recommended.  Right adrenal lesion likely representing an adenoma.  Bilateral nonobstructing renal stones.  Partial obstruction of the right ureter secondary to the changes in the rectosigmoid region. No stone is identified.  These results will be called to the ordering clinician or representative by the Radiologist Assistant, and communication documented in the PACS or zVision Dashboard.  FLEXIBLE SIGMOIDOSCOPY  09/15/2015 Malignant partially obstructing tumor in the recto-sigmoid colon. Biopsied. - The examination was otherwise normal.   ASSESSMENT & PLAN: 82 year old Caucasian male with past medical history of peripheral neuropathy on feet, hearing loss, otherwise healthy, but not very active, presented with chronic diarrhea for 4 years and episodic rectal bleeding for 4 months.  1. Rectosigmoid colon cancer, [id number] -I reviewed his biopsy and CT scan findings. -He CT chest abdomen and pelvis was negative for distant metastasis. -He has bilateral renal stone, and the right ureter obstruction secondary to the rectosigmoid colon cancer, and the renal impairment with creatinine 1.7 -He is scheduled to see urologist, he may need a ureteral stent placement for the obstructive uropathy from tumor. -We discussed that surgery is the standard and only curative treatment option. Due to his advanced age, patient and his family members are very concerned about his risk of surgery, and the potential impact on his quality of life after surgery, which is understandable and reasonable. -If he does not want surgery, I would like him to follow-up with ductal Carlean Purl to see if it's possible to have a stent in rectosigmoid colon to improve his partial obstruction symptoms from tumor. -We discussed the role of radiation, which is likely to be palliative, for bleeding or obstruction. -We discussed the role of systemic chemotherapy, which will be palliative. Given his advanced age and the limited performance status, I would not recommend chemotherapy, I think the side effects is overweight the benefit, because quality of life is more important at his age. -We discussed the other options of systemic therapy, such as immunotherapy or EGFR inhibitor, if his tumor has certain genomic features. I will reserve this if he develops metastatic disease.  -We reviewed the natural course of colon cancer. Some colon cancer are relatively  indolent, he has had diarrhea for 3-4 years, his colon cancer may be indolent, may not metastasize very soon.  -After lengthy discussion, patient agrees to see urologist and Dr. Carlean Purl for possible stent placement  -I will follow him for disease monitoring.   2. Anemia -He has mild anemia, I will check his iron study to see if he has iorn deficiency  3. CKD  -Likely secondary to obstructive uropathy from his colon cancer -His, see urologist soon.   Recommendations: Based on information available as of today's consult. Recommendations may change depending on the results of further tests or exams. 1) Colonic stent per GI and see urologist for ureteral stent placement 2) No chemotherapy at this time-may revisit immunotherapy in future if tumor grows or spreads and radiation in future to control bleeding if needed 3) Return to Dr. Burr Medico in 2 months, but call sooner for any problems Next Steps: 1) Labs today to assess status of anemia 2) See urology and GI within next month 3)  Dr. Burr Medico in 2 months ______________________________________________________________________________  All questions were answered. The patient knows to call the clinic with any problems, questions or concerns. I spent 55 minutes counseling the patient face to face. The total time spent in the appointment was 60 minutes and more than 50% was on counseling.     Truitt Merle, MD 10/06/2015 11:45 AM  Addendum,  His iron study showed normal ferritin, low serum iron and saturation. I recommend him to take ferrous sulfate 1 tablet a day  Truitt Merle

## 2015-10-06 NOTE — Telephone Encounter (Signed)
Gave pt apt & avs °

## 2015-10-07 ENCOUNTER — Telehealth: Payer: Self-pay

## 2015-10-07 NOTE — Telephone Encounter (Signed)
Patient's wife contacted he will come discuss possible stent on 10/17/15 1:45

## 2015-10-07 NOTE — Telephone Encounter (Signed)
-----   Message from Tania Ade, RN sent at 10/06/2015  4:21 PM EDT ----- Regarding: Re: Return appointment Alisha saw Dr. Burr Medico today and has already seen Dr. Marcello Moores. Family is leaning towards a colonic stent and no surgery. Will be seeing urology next week regarding the right ureteral obstruction. Can Dr. Carlean Purl get him in to talk about the stent soon?  Thanks, Manuela Schwartz

## 2015-10-12 ENCOUNTER — Telehealth: Payer: Self-pay | Admitting: *Deleted

## 2015-10-12 NOTE — Telephone Encounter (Signed)
-----   Message from Truitt Merle, MD sent at 10/12/2015  9:16 AM EDT ----- Janifer Adie,   Please call pt's daughter or wife and let them know his lab result. Cr is stable, he has mild iron deficiency, let him try OTC orin pill (any form, such as ferrous sulfate) once a day, and continue if he can tolerate. Reminded him to watch for constipation.  Thanks,  Krista Blue

## 2015-10-12 NOTE — Telephone Encounter (Signed)
Called & spoke with pt's wife & informed of lab results per Dr Burr Medico & suggested ferrous sulfate OTC.  She reports that pt had been on iron RX before & should have some at home.  She doesn't know what it is but states that he tol it.  Reminded about constipation & suggested increasing fluids & can take stool softener if needed.  Asked that if she could find the iron rx to let us know what it is & otherwise p/u ferrous sulfate.  Wife expressed understanding.

## 2015-10-13 ENCOUNTER — Telehealth: Payer: Self-pay

## 2015-10-13 NOTE — Telephone Encounter (Signed)
Patient was originally scheduled for you on 10/17/15.  Wife cancelled.  I called her today to find out why and encourage her to reschedule.  She declines at this time.  She wants to wait for her daughter to return from [place].  She states that she and her husband don't feel comfortable making any decisions about the stent placement without her.  She is not sure that they want to proceed at all "worry about his quality of life".  She states that she will have the daughter call when she returns from [place].

## 2015-10-13 NOTE — Telephone Encounter (Signed)
-----   Message from Gatha Mayer, MD sent at 10/13/2015 10:51 AM EDT ----- Regarding: appt please I have been asked to consider a colonic stent for him Please add him on for next week in ofc - perhaps 6/12

## 2015-10-13 NOTE — Telephone Encounter (Signed)
I called and spoke again with his wife.  They would like to wait until Dr. Carlean Purl returns and she will have her daughter call back to set up an appt for when he returns in July.

## 2015-10-13 NOTE — Telephone Encounter (Signed)
OK I agree with that completely you can let her know. i am not sure a stent is right - maybe - and would require discussions with everyone. If they do want to talk about it will need to be one of my partners while I am away - I briefly spoke about him with Dr. Ardis Hughs and will keep him in the loop in case this comes up while we are away and also let Christian Mate know. Please relay this to the wife.

## 2015-10-17 ENCOUNTER — Ambulatory Visit: Payer: Medicare Other | Admitting: Internal Medicine

## 2015-10-24 ENCOUNTER — Telehealth: Payer: Self-pay | Admitting: *Deleted

## 2015-10-24 NOTE — Telephone Encounter (Signed)
Oncology Nurse Navigator Documentation  Oncology Nurse Navigator Flowsheets 10/24/2015  Navigator Location [facility]  Navigator Encounter Type Telephone  Telephone Outgoing Call;Patient Update  Abnormal Finding Date -  Confirmed Diagnosis Date -  Patient Visit Type -  Treatment Phase -  Barriers/Navigation Needs -Had not received urology office note (ROI sent 10/17/15). Called wife to follow up.  Education -  Interventions -Garth has not seen urology or GI--they are waiting for daughter to be available and for Dr. Carlean Purl to be be back in town. Did not want an MD they do not know to put in stent. She reports he is doing well.  Coordination of Care -  Education Method -  Support Groups/Services -  Acuity -  Time Spent with Patient 15

## 2015-11-14 ENCOUNTER — Telehealth: Payer: Self-pay | Admitting: *Deleted

## 2015-11-14 NOTE — Telephone Encounter (Signed)
  Oncology Nurse Navigator Documentation  Navigator Location: [facility] (11/14/15 1524) Navigator Encounter Type: Telephone (11/14/15 1524) Telephone: Outgoing Call;Patient Update (11/14/15 1524)   Spoke w/wife regarding status of his return appointment to [facility]. They have decided to wait on this until he is seen by Dr. Carlean Purl in August. Will talk about the colon stent and if he really needs to pursue the ureteral stent. He is doing well-bowels move (loose) and he is not in any pain. Family does not want to be too aggressive considering his age and quality of life. Confirmed the 12/02/15 appointment he has with Dr. Burr Medico.                                       Time Spent with Patient: 15 (11/14/15 1524)

## 2015-12-02 ENCOUNTER — Other Ambulatory Visit (HOSPITAL_BASED_OUTPATIENT_CLINIC_OR_DEPARTMENT_OTHER): Payer: Medicare Other

## 2015-12-02 ENCOUNTER — Telehealth: Payer: Self-pay | Admitting: Hematology

## 2015-12-02 ENCOUNTER — Ambulatory Visit (HOSPITAL_BASED_OUTPATIENT_CLINIC_OR_DEPARTMENT_OTHER): Payer: Medicare Other | Admitting: Hematology

## 2015-12-02 ENCOUNTER — Encounter: Payer: Self-pay | Admitting: Hematology

## 2015-12-02 VITALS — BP 137/45 | HR 61 | Temp 98.0°F | Resp 18 | Ht 65.0 in | Wt 180.2 lb

## 2015-12-02 DIAGNOSIS — C19 Malignant neoplasm of rectosigmoid junction: Secondary | ICD-10-CM | POA: Diagnosis not present

## 2015-12-02 DIAGNOSIS — N183 Chronic kidney disease, stage 3 unspecified: Secondary | ICD-10-CM

## 2015-12-02 DIAGNOSIS — C186 Malignant neoplasm of descending colon: Secondary | ICD-10-CM | POA: Diagnosis present

## 2015-12-02 DIAGNOSIS — D509 Iron deficiency anemia, unspecified: Secondary | ICD-10-CM

## 2015-12-02 LAB — COMPREHENSIVE METABOLIC PANEL
AST: 9 U/L (ref 5–34)
Albumin: 3 g/dL — ABNORMAL LOW (ref 3.5–5.0)
Alkaline Phosphatase: 87 U/L (ref 40–150)
Anion Gap: 8 mEq/L (ref 3–11)
BILIRUBIN TOTAL: 0.45 mg/dL (ref 0.20–1.20)
BUN: 22.5 mg/dL (ref 7.0–26.0)
CO2: 25 meq/L (ref 22–29)
CREATININE: 1.7 mg/dL — AB (ref 0.7–1.3)
Calcium: 8.7 mg/dL (ref 8.4–10.4)
Chloride: 105 mEq/L (ref 98–109)
EGFR: 35 mL/min/{1.73_m2} — ABNORMAL LOW (ref >90)
GLUCOSE: 92 mg/dL (ref 70–140)
Potassium: 4.6 mEq/L (ref 3.5–5.1)
SODIUM: 139 meq/L (ref 136–145)
TOTAL PROTEIN: 6.4 g/dL (ref 6.4–8.3)

## 2015-12-02 LAB — IRON AND TIBC
%SAT: 20 % (ref 20–55)
Iron: 44 ug/dL (ref 42–163)
TIBC: 220 ug/dL (ref 202–409)
UIBC: 177 ug/dL (ref 117–376)

## 2015-12-02 LAB — CBC WITH DIFFERENTIAL/PLATELET
BASO%: 0.7 % (ref 0.0–2.0)
Basophils Absolute: 0.1 10*3/uL (ref 0.0–0.1)
EOS%: 4 % (ref 0.0–7.0)
Eosinophils Absolute: 0.3 10*3/uL (ref 0.0–0.5)
HCT: 36.8 % — ABNORMAL LOW (ref 38.4–49.9)
HEMOGLOBIN: 11.8 g/dL — AB (ref 13.0–17.1)
LYMPH%: 8.7 % — AB (ref 14.0–49.0)
MCH: 28.6 pg (ref 27.2–33.4)
MCHC: 32.2 g/dL (ref 32.0–36.0)
MCV: 88.8 fL (ref 79.3–98.0)
MONO#: 0.7 10*3/uL (ref 0.1–0.9)
MONO%: 8.5 % (ref 0.0–14.0)
NEUT%: 78.1 % — ABNORMAL HIGH (ref 39.0–75.0)
NEUTROS ABS: 6.4 10*3/uL (ref 1.5–6.5)
Platelets: 266 10*3/uL (ref 140–400)
RBC: 4.15 10*6/uL — AB (ref 4.20–5.82)
RDW: 15.7 % — AB (ref 11.0–14.6)
WBC: 8.1 10*3/uL (ref 4.0–10.3)
lymph#: 0.7 10*3/uL — ABNORMAL LOW (ref 0.9–3.3)

## 2015-12-02 LAB — FERRITIN: Ferritin: 56 ng/ml (ref 22–316)

## 2015-12-02 NOTE — Progress Notes (Signed)
[facility]  [phone] [phone]  Clinic Follow up Note   Patient Care Team: Angelina Pih, MD as PCP - General (Family Medicine) 12/02/2015  CHIEF COMPLAINTS:  Follow up untreated colon cancer     Cancer of left colon ([place])   06/20/2015 Imaging    CT ABD/PELVIS: colonic neoplasm rectosigmoid with partial obstructin right ureter     09/15/2015 Pathology Results    Adenocarcinoma     09/15/2015 Procedure    SIGMOIDOSCOPY: Partially obstructing mass at rectosigmoid colon;circumferential; at least 5 cm long;17-22 cm depth of invasion     09/22/2015 Tumor Marker    CEA= <0.5     09/23/2015 Imaging    CT CHEST: 1.8 cm subcarinal mediastinal lymph node-nonspecific     10/05/2015 Initial Diagnosis    Colon cancer ([facility])      HISTORY OF PRESENTING ILLNESS:  Tommy Rivera 82 y.o. male is here because of his newly diagnosed colon cancer. He is accompanied by his wife and daughter to the clinic today.   He has had chronic diarrhea for 4 years, and episodic rectal bleeding for 4-6 months, he only had 2-3 episodes of bleeding, which resolved on it's own, no pain , nausea, or weight loss  He was seen by PCP and referred to GI Dr. Paulita Fujita. Colonoscopy was discussed and the patient declined. He then saw Dr. Carlean Purl for second opinion, who recommended flexible sigmoidoscopy. Patient agreed and underwent the procedure on 09/15/2015, which showed a partially obstructing mass at the rectosigmoid colon, circumferential, at least 5 cm long. Biopsies showed adenocarcinoma. He was referred to see colorectal surgeon Dr. Marcello Moores, surgery was offered, but the patient and his family members are very concerned about the risk of surgery due to his advanced age. They would like to know the other treatment options.  He lives with his wife, is able to take care of his ADLs, but does not do much else. He used to drive and does yard work up to a year ago, much less active  lately. He is very hard of hearing.  CURRENT THERAPY: observation  INTERIM HISTORY: [name] returns for follow-up. He is doing well overall, no new complaints. He still has small loose stools 2-4 times a day, a few episodes of small amount of blood in the stool. He denies any new pain, abdominal bloating, or other new symptoms. He lost about 10 pounds in the past 2 months.   MEDICAL HISTORY:  Past Medical History:  Diagnosis Date  . Anemia   . Brown recluse spider bite   . Depression   . History of blood transfusion   . Neuropathy ([place])     SURGICAL HISTORY: Past Surgical History:  Procedure Laterality Date  . CATARACT EXTRACTION    . PROSTATE SURGERY  2001    SOCIAL HISTORY: Social History   Social History  . Marital status: Married    Spouse name: N/A  . Number of children: 2  . Years of education: N/A   Occupational History  . Not on file.   Social History Main Topics  . Smoking status: Former Smoker    Packs/day: 1.00    Years: 20.00    Types: Cigarettes    Quit date: 05/07/1964  . Smokeless tobacco: Not on file  . Alcohol use 0.6 oz/week    1 Shots of liquor per week     Comment: he used to drink daily, not much lately   . Drug use: No  .  Sexual activity: Not on file   Other Topics Concern  . Not on file   Social History Narrative   Married, wife Tessie Fass - retired [employer] here to [employer] 2015-16--says he misses [place]   Was in the [employer] for 6 years   Has a daughter in [place] -His other daughter died from breast cancer     FAMILY HISTORY: Family History  Problem Relation Age of Onset  . Heart defect Mother   . Breast cancer Daughter   . Cancer Daughter 46    breast cancer  . Cancer Daughter 31    breast cancer   . Cancer Cousin     prostate cancer     ALLERGIES:  has No Known Allergies.  MEDICATIONS:  Current Outpatient Prescriptions  Medication Sig Dispense Refill  . gabapentin (NEURONTIN) 600 MG tablet Take  600 mg by mouth 3 (three) times daily.    Marland Kitchen loperamide (IMODIUM A-D) 2 MG tablet Take 2 mg by mouth. Take 1 tablet once or twice a day as needed     No current facility-administered medications for this visit.     REVIEW OF SYSTEMS:   Constitutional: Denies fevers, chills or abnormal night sweats Eyes: Denies blurriness of vision, double vision or watery eyes Ears, nose, mouth, throat, and face: Denies mucositis or sore throat Respiratory: Denies cough, dyspnea or wheezes Cardiovascular: Denies palpitation, chest discomfort or lower extremity swelling Gastrointestinal:  Denies nausea, heartburn or change in bowel habits Skin: Denies abnormal skin rashes Lymphatics: Denies new lymphadenopathy or easy bruising Neurological:Denies numbness, tingling or new weaknesses Behavioral/Psych: Mood is stable, no new changes  All other systems were reviewed with the patient and are negative.  PHYSICAL EXAMINATION: ECOG PERFORMANCE STATUS: 2 - Symptomatic, <50% confined to bed  Vitals:   12/02/15 1309  BP: (!) 137/45  Pulse: 61  Resp: 18  Temp: 98 F (36.7 C)   Filed Weights   12/02/15 1309  Weight: 180 lb 3.2 oz (81.7 kg)    GENERAL:alert, no distress and comfortable SKIN: skin color, texture, turgor are normal, no rashes or significant lesions EYES: normal, conjunctiva are pink and non-injected, sclera clear OROPHARYNX:no exudate, no erythema and lips, buccal mucosa, and tongue normal  NECK: supple, thyroid normal size, non-tender, without nodularity LYMPH:  no palpable lymphadenopathy in the cervical, axillary or inguinal LUNGS: clear to auscultation and percussion with normal breathing effort HEART: regular rate & rhythm and no murmurs and no lower extremity edema ABDOMEN:abdomen soft, non-tender and normal bowel sounds Musculoskeletal:no cyanosis of digits and no clubbing  PSYCH: alert & oriented x 3 with fluent speech NEURO: no focal motor/sensory deficits  LABORATORY DATA:    I have reviewed the data as listed CBC Latest Ref Rng & Units 12/02/2015 10/06/2015 09/22/2015  WBC 4.0 - 10.3 10e3/uL 8.1 8.9 10.0  Hemoglobin 13.0 - 17.1 g/dL 11.8(L) 11.9(L) 11.8(L)  Hematocrit 38.4 - 49.9 % 36.8(L) 37.4(L) 35.8(L)  Platelets 140 - 400 10e3/uL 266 282 326.0   CMP Latest Ref Rng & Units 12/02/2015 10/06/2015 09/22/2015  Glucose 70 - 140 mg/dl 92 89 84  BUN 7.0 - 26.0 mg/dL 22.5 26.5(H) 24(H)  Creatinine 0.7 - 1.3 mg/dL 1.7(H) 1.7(H) 1.69(H)  Sodium 136 - 145 mEq/L 139 140 140  Potassium 3.5 - 5.1 mEq/L 4.6 4.8 4.9  Chloride 96 - 112 mEq/L - - 106  CO2 22 - 29 mEq/L 25 28 28   Calcium 8.4 - 10.4 mg/dL 8.7 9.2 9.1  Total Protein 6.4 - 8.3 g/dL 6.4 6.9 6.4  Total Bilirubin 0.20 - 1.20 mg/dL 0.45 0.50 0.4  Alkaline Phos 40 - 150 U/L 87 87 78  AST 5 - 34 U/L 9 9 7   ALT 0 - 55 U/L <9 <9 5   PATHOLOGY REPORT  Colon, biopsy, recto-sigmoid mass 09/15/2015 ADENOCARCINOMA Microscopic Comment Diagnosis of malignancy has been confirmed with Dr. Lyndon Code.  RADIOGRAPHIC STUDIES: I have personally reviewed the radiological images as listed and agreed with the findings in the report. No results found. Ct ABDOMEN AND PELVIS W CONTRAST 06/20/2015 IMPRESSION: Changes consistent with a colonic neoplasm at the rectosigmoid and till proven otherwise. Direct visualization is recommended.  Right adrenal lesion likely representing an adenoma.  Bilateral nonobstructing renal stones.  Partial obstruction of the right ureter secondary to the changes in the rectosigmoid region. No stone is identified.  These results will be called to the ordering clinician or representative by the Radiologist Assistant, and communication documented in the PACS or zVision Dashboard.  FLEXIBLE SIGMOIDOSCOPY 09/15/2015 Malignant partially obstructing tumor in the recto-sigmoid colon. Biopsied. - The examination was otherwise normal.   ASSESSMENT & PLAN: 82 year old Caucasian male with past medical  history of peripheral neuropathy on feet, hearing loss, otherwise healthy, but not very active, presented with chronic diarrhea for 4 years and episodic rectal bleeding for 4 months.  1. Rectosigmoid colon cancer, [id number] -I reviewed his biopsy and CT scan findings. -He CT chest abdomen and pelvis was negative for distant metastasis. -He has bilateral renal stone, and the right ureter obstruction secondary to the rectosigmoid colon cancer, and the renal impairment with creatinine 1.7 -He has seen urologist, but has not decided about stent placement.  -We discussed that surgery is the standard and only curative treatment option. Due to his advanced age, patient and his family members are very concerned about his risk of surgery, and the potential impact on his quality of life after surgery, which is understandable and reasonable. -I recommended him to follow-up with ductal Carlean Purl to see if it's possible to have a stent in rectosigmoid colon to improve his partial obstruction symptoms from tumor, he has an appointment soon.  -We discussed the role of radiation, which is likely to be palliative, for bleeding or obstruction. -We discussed the role of systemic chemotherapy, which will be palliative. Given his advanced age and the limited performance status, I would not recommend chemotherapy, I think the side effects is overweight the benefit, because quality of life is more important at his age. -We discussed the other options of systemic therapy, such as immunotherapy or EGFR inhibitor, if his tumor has certain genomic features. I will reserve this if he develops metastatic disease.  -We reviewed the natural course of colon cancer. Some colon cancer are relatively indolent, he has had diarrhea for 3-4 years, his colon cancer may be indolent, may not metastasize very soon.  -The patient has decided to be monitored after stent placement. We'll continue follow-up clinically.   2. Anemia -He has mild  anemia. - his iron level has improved. He will continue oral ferrous sulfate.   3. CKD  -Likely secondary to obstructive uropathy from his colon cancer -His, see urologist soon.  Plan -He will see Dr. Carlean Purl to consider clonic stent placement  -I will see him back in 3 months with lab   All questions were answered. The patient knows to call the clinic with any problems, questions or concerns.  I spent 25 minutes counseling the patient  face to face. The total time spent in the appointment was 30 minutes and more than 50% was on counseling.     Truitt Merle, MD 12/02/2015 8:04 AM

## 2015-12-02 NOTE — Telephone Encounter (Signed)
Gave pt cal & avs °

## 2015-12-15 ENCOUNTER — Encounter: Payer: Self-pay | Admitting: Hematology

## 2015-12-15 DIAGNOSIS — D509 Iron deficiency anemia, unspecified: Secondary | ICD-10-CM | POA: Insufficient documentation

## 2015-12-15 DIAGNOSIS — N183 Chronic kidney disease, stage 3 unspecified: Secondary | ICD-10-CM | POA: Insufficient documentation

## 2016-01-02 ENCOUNTER — Encounter: Payer: Self-pay | Admitting: Internal Medicine

## 2016-01-02 ENCOUNTER — Ambulatory Visit (INDEPENDENT_AMBULATORY_CARE_PROVIDER_SITE_OTHER): Payer: Medicare Other | Admitting: Internal Medicine

## 2016-01-02 DIAGNOSIS — K56699 Other intestinal obstruction unspecified as to partial versus complete obstruction: Secondary | ICD-10-CM

## 2016-01-02 DIAGNOSIS — C186 Malignant neoplasm of descending colon: Secondary | ICD-10-CM | POA: Diagnosis not present

## 2016-01-02 DIAGNOSIS — K5669 Other intestinal obstruction: Secondary | ICD-10-CM

## 2016-01-02 DIAGNOSIS — N1330 Unspecified hydronephrosis: Secondary | ICD-10-CM | POA: Diagnosis not present

## 2016-01-02 HISTORY — DX: Other intestinal obstruction unspecified as to partial versus complete obstruction: K56.699

## 2016-01-02 NOTE — Assessment & Plan Note (Signed)
I have some concern about a colonic stent worsening this

## 2016-01-02 NOTE — Patient Instructions (Signed)
  You have been scheduled for a CT scan of the abdomen and pelvis at [facility] ([address]---this is in the same building as Press [facility]).   You are scheduled on 01/05/16 at 3:00pm. You should arrive 15 minutes prior to your appointment time for registration. Please follow the written instructions below on the day of your exam:  WARNING: IF YOU ARE ALLERGIC TO IODINE/X-RAY DYE, PLEASE NOTIFY RADIOLOGY IMMEDIATELY AT [phone]! YOU WILL BE GIVEN A 13 HOUR PREMEDICATION PREP.  1) Do not eat or drink anything after 11:00AM (4 hours prior to your test) 2) You have been given 2 bottles of oral contrast to drink. The solution may taste               better if refrigerated, but do NOT add ice or any other liquid to this solution. Shake             well before drinking.    Drink 1 bottle of contrast @ 1:00pm (2 hours prior to your exam)  Drink 1 bottle of contrast @ 2:00pm (1 hour prior to your exam)  You may take any medications as prescribed with a small amount of water except for the following: Metformin, Glucophage, Glucovance, Avandamet, Riomet, Fortamet, Actoplus Met, Janumet, Glumetza or Metaglip. The above medications must be held the day of the exam AND 48 hours after the exam.  The purpose of you drinking the oral contrast is to aid in the visualization of your intestinal tract. The contrast solution may cause some diarrhea. Before your exam is started, you will be given a small amount of fluid to drink. Depending on your individual set of symptoms, you may also receive an intravenous injection of x-ray contrast/dye. Plan on being at [facility] for 30 minutes or longer, depending on the type of exam you are having performed.  This test typically takes 30-45 minutes to complete.  If you have any questions regarding your exam or if you need to reschedule, you may call the CT department at [phone] between the hours of 8:00 am and 5:00 pm,  Monday-Friday.  ________________________________________________________________________  I appreciate the opportunity to care for you. Silvano Rusk, MD, [facility]

## 2016-01-02 NOTE — Assessment & Plan Note (Signed)
Seems stable CT f/u Sxs do not seem to warrant a stent now

## 2016-01-02 NOTE — Assessment & Plan Note (Signed)
Reassess w/ CT

## 2016-01-02 NOTE — Progress Notes (Signed)
   Subjective:    Patient ID: Tommy Rivera, male    DOB: 03-15-1922, 82 y.o.   MRN: [id number] Cc: colon cancer with stricture HPI Here w/ daughter and wifeAnd they participate in the history. Had a sigmoid colon cancer diagnosed in May. He has seen Dr. Marcello Moores of surgery and Dr. Burr Medico of medical oncology.  Doing well on low fiber diet 3 formed stools a day and no sig bleeding, no pain, nausea or vomiting  Is in observation modeRegarding colon cancer. He is not interested in surgery. Medications, allergies, past medical history, past surgical history, family history and social history are reviewed and updated in the EMR.  Review of Systems As above    Objective:   Physical Exam BP (!) 96/40   Pulse 74   Ht 5\' 5"  (1.651 m)   Wt 176 lb 8 oz (80.1 kg)   BMI 29.37 kg/m  Elderly white man younger than stated age He is hard of hearing Abdomen is somewhat protuberant soft and mildly tympanitic but not tender in any way bowel sounds are present He answers questions appropriately when he can hear that  CT scan of the abdomen and pelvis in February demonstrated mild right hydronephrosis, and the sigmoid lesion. This is reviewed again today. Flexible sigmoidoscopy and biopsy results reviewed as well.    Assessment & Plan:   1. Cancer of left colon ([place])   2. Stricture of sigmoid colon ([place])   3. Hydronephrosis of right kidney    Seems to be doing well at this point. I explained the timing of the stent is somewhat difficult, and if he is moving his bowels away he is are not sure he needs one right now. I have some concern about stenting causing more compression of the right ureter. That may not cause any problems for him but it could. We reviewed the nature and indications of colonic stenting and the possible complications including perforation and bleeding. I showed them pictures and cartoons that explain it and provided a handout.  I will  Have him a repeat CT scan abd/pelvis with oral and  rectal contrast. I decided not to do IV contrast to his creatinine of 1.7. Soma limited exam too much. Once the CT is completed I will call them with the results and suggestions. At this point it sounds like observing him and following further while make sense. We discussed the possibility of a diverting colostomy and how that could be needed as well.  25 minutes time spent with patient > half in counseling coordination of care        I appreciate the opportunity to care for this patient. I will send a copy to Dr. Truitt Merle

## 2016-01-05 ENCOUNTER — Ambulatory Visit (INDEPENDENT_AMBULATORY_CARE_PROVIDER_SITE_OTHER)
Admission: RE | Admit: 2016-01-05 | Discharge: 2016-01-05 | Disposition: A | Discharge status: Home / Self Care | Payer: Medicare Other | Source: Ambulatory Visit | Attending: Internal Medicine | Admitting: Internal Medicine

## 2016-01-05 DIAGNOSIS — N132 Hydronephrosis with renal and ureteral calculous obstruction: Secondary | ICD-10-CM | POA: Diagnosis not present

## 2016-01-05 DIAGNOSIS — K5669 Other intestinal obstruction: Secondary | ICD-10-CM | POA: Diagnosis not present

## 2016-01-05 DIAGNOSIS — C186 Malignant neoplasm of descending colon: Secondary | ICD-10-CM

## 2016-01-05 DIAGNOSIS — K56699 Other intestinal obstruction unspecified as to partial versus complete obstruction: Secondary | ICD-10-CM

## 2016-01-10 NOTE — Progress Notes (Signed)
Let them know - patient or wife and daughter that there is slight increase in the cancer I am going to review at cancer conference and get back to them re: timing of stent - I do not see pressing need to place now but want to discuss with other doctors

## 2016-01-12 NOTE — Progress Notes (Signed)
I spoke to Tommy Rivera Explained that consensus at cancer conference was: No colon stent now  He needs to see urology re: right hydronephrosis and elevated creatinine - we think ureteral stent likely needed  Please schedule a November f/u with me  I have advised they call back sooner if worsening constipation, new abdominal distention, etc

## 2016-02-29 ENCOUNTER — Encounter: Payer: Self-pay | Admitting: Hematology

## 2016-02-29 ENCOUNTER — Other Ambulatory Visit (HOSPITAL_BASED_OUTPATIENT_CLINIC_OR_DEPARTMENT_OTHER): Payer: Medicare Other

## 2016-02-29 ENCOUNTER — Ambulatory Visit (HOSPITAL_BASED_OUTPATIENT_CLINIC_OR_DEPARTMENT_OTHER): Payer: Medicare Other | Admitting: Hematology

## 2016-02-29 VITALS — BP 152/54 | HR 65 | Temp 98.6°F | Resp 17 | Ht 65.0 in | Wt 173.4 lb

## 2016-02-29 DIAGNOSIS — C186 Malignant neoplasm of descending colon: Secondary | ICD-10-CM | POA: Diagnosis present

## 2016-02-29 DIAGNOSIS — N183 Chronic kidney disease, stage 3 unspecified: Secondary | ICD-10-CM

## 2016-02-29 DIAGNOSIS — D509 Iron deficiency anemia, unspecified: Secondary | ICD-10-CM

## 2016-02-29 DIAGNOSIS — C19 Malignant neoplasm of rectosigmoid junction: Secondary | ICD-10-CM

## 2016-02-29 DIAGNOSIS — D500 Iron deficiency anemia secondary to blood loss (chronic): Secondary | ICD-10-CM

## 2016-02-29 LAB — CBC WITH DIFFERENTIAL/PLATELET
BASO%: 0.7 % (ref 0.0–2.0)
Basophils Absolute: 0 10*3/uL (ref 0.0–0.1)
EOS ABS: 0.3 10*3/uL (ref 0.0–0.5)
EOS%: 3.9 % (ref 0.0–7.0)
HEMATOCRIT: 37.5 % — AB (ref 38.4–49.9)
HEMOGLOBIN: 11.9 g/dL — AB (ref 13.0–17.1)
LYMPH%: 9.2 % — AB (ref 14.0–49.0)
MCH: 28.2 pg (ref 27.2–33.4)
MCHC: 31.8 g/dL — ABNORMAL LOW (ref 32.0–36.0)
MCV: 88.7 fL (ref 79.3–98.0)
MONO#: 0.7 10*3/uL (ref 0.1–0.9)
MONO%: 10 % (ref 0.0–14.0)
NEUT%: 76.2 % — ABNORMAL HIGH (ref 39.0–75.0)
NEUTROS ABS: 5.1 10*3/uL (ref 1.5–6.5)
PLATELETS: 282 10*3/uL (ref 140–400)
RBC: 4.23 10*6/uL (ref 4.20–5.82)
RDW: 15 % — ABNORMAL HIGH (ref 11.0–14.6)
WBC: 6.6 10*3/uL (ref 4.0–10.3)
lymph#: 0.6 10*3/uL — ABNORMAL LOW (ref 0.9–3.3)

## 2016-02-29 LAB — COMPREHENSIVE METABOLIC PANEL
ALBUMIN: 2.9 g/dL — AB (ref 3.5–5.0)
ALK PHOS: 89 U/L (ref 40–150)
ALT: <6 U/L (ref 0–55)
ANION GAP: 7 meq/L (ref 3–11)
AST: 9 U/L (ref 5–34)
BILIRUBIN TOTAL: 0.44 mg/dL (ref 0.20–1.20)
BUN: 22.8 mg/dL (ref 7.0–26.0)
CALCIUM: 8.4 mg/dL (ref 8.4–10.4)
CO2: 24 mEq/L (ref 22–29)
Chloride: 109 mEq/L (ref 98–109)
Creatinine: 1.6 mg/dL — ABNORMAL HIGH (ref 0.7–1.3)
EGFR: 36 mL/min/{1.73_m2} — AB (ref >90)
Glucose: 80 mg/dl (ref 70–140)
Potassium: 4.5 mEq/L (ref 3.5–5.1)
Sodium: 141 mEq/L (ref 136–145)
TOTAL PROTEIN: 6.4 g/dL (ref 6.4–8.3)

## 2016-02-29 LAB — IRON AND TIBC
%SAT: 17 % — ABNORMAL LOW (ref 20–55)
IRON: 38 ug/dL — AB (ref 42–163)
TIBC: 229 ug/dL (ref 202–409)
UIBC: 191 ug/dL (ref 117–376)

## 2016-02-29 LAB — FERRITIN: FERRITIN: 49 ng/mL (ref 22–316)

## 2016-02-29 NOTE — Progress Notes (Signed)
[facility]  [phone] [phone]) [phone]  Clinic Follow up Note   Patient Care Team: Angelina Pih, MD as PCP - General (Family Medicine) 02/29/2016  CHIEF COMPLAINTS:  Follow up untreated colon cancer     Cancer of left colon ([place])   06/20/2015 Imaging    CT ABD/PELVIS: colonic neoplasm rectosigmoid with partial obstructin right ureter      09/15/2015 Pathology Results    Adenocarcinoma      09/15/2015 Procedure    SIGMOIDOSCOPY: Partially obstructing mass at rectosigmoid colon;circumferential; at least 5 cm long;17-22 cm depth of invasion      09/22/2015 Tumor Marker    CEA= <0.5      09/23/2015 Imaging    CT CHEST: 1.8 cm subcarinal mediastinal lymph node-nonspecific      10/05/2015 Initial Diagnosis    Colon cancer (HCC)       HISTORY OF PRESENTING ILLNESS:  Tommy Rivera 82 y.o. male is here because of his newly diagnosed colon cancer. He is accompanied by his wife and daughter to the clinic today.   He has had chronic diarrhea for 4 years, and episodic rectal bleeding for 4-6 months, he only had 2-3 episodes of bleeding, which resolved on it's own, no pain , nausea, or weight loss  He was seen by PCP and referred to GI Dr. Paulita Fujita. Colonoscopy was discussed and the patient declined. He then saw Dr. Carlean Purl for second opinion, who recommended flexible sigmoidoscopy. Patient agreed and underwent the procedure on 09/15/2015, which showed a partially obstructing mass at the rectosigmoid colon, circumferential, at least 5 cm long. Biopsies showed adenocarcinoma. He was referred to see colorectal surgeon Dr. Marcello Moores, surgery was offered, but the patient and his family members are very concerned about the risk of surgery due to his advanced age. They would like to know the other treatment options.  He lives with his wife, is able to take care of his ADLs, but does not do much else. He used to drive and does yard work up to a year ago, much less  active lately. He is very hard of hearing.  CURRENT THERAPY: observation  INTERIM HISTORY: Tommy Rivera returns for follow-up. He is clinically stable and doing moderately well overall.  He still has mild diarrhea, with loose BM 2-5 times a day, he takes imodium as needed, no abdominal pain nausea, vomiting, was new symptoms. He has urinary frequency, no dysuria. No other new complains   MEDICAL HISTORY:  Past Medical History:  Diagnosis Date  . Anemia   . Brown recluse spider bite   . Depression   . History of blood transfusion   . Neuropathy ([place])   . 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 CATARACT EXTRACTION    . PROSTATE SURGERY  2001    SOCIAL HISTORY: Social History   Social History  . Marital status: Married    Spouse name: N/A  . Number of children: 2  . Years of education: N/A   Occupational History  . Not on file.   Social History Main Topics  . Smoking status: Former Smoker    Packs/day: 1.00    Years: 20.00    Types: Cigarettes    Quit date: 05/07/1964  . Smokeless tobacco: Never Used  . Alcohol use 0.6 oz/week    1 Shots of liquor per week     Comment: he used to drink daily, not much lately   .  Drug use: No  . Sexual activity: Not on file   Other Topics Concern  . Not on file   Social History Narrative   Married, wife Tessie Fass - retired [employer] here to [place] 2015-16--says he misses [place]   Was in the [employer] for 6 years   Has a daughter in [place] -His other daughter died from breast cancer     FAMILY HISTORY: Family History  Problem Relation Age of Onset  . Heart defect Mother   . Breast cancer Daughter   . Cancer Daughter 54    breast cancer  . Cancer Daughter 93    breast cancer   . Cancer Cousin     prostate cancer     ALLERGIES:  has No Known Allergies.  MEDICATIONS:  Current Outpatient Prescriptions  Medication Sig Dispense Refill    . ASTRAGALUS PO Take by mouth 2 (two) times daily. 40 drops mix in water  BID.    . ferrous sulfate 325 (65 FE) MG tablet 1 tablet    . gabapentin (NEURONTIN) 600 MG tablet Take 600 mg by mouth at bedtime.     Marland Kitchen loperamide (IMODIUM A-D) 2 MG tablet Take 2 mg by mouth at bedtime. Take 1 tablet once or twice a day as needed     . NON FORMULARY My community mushroom packet -  Herbal med.     No current facility-administered medications for this visit.     REVIEW OF SYSTEMS:   Constitutional: Denies fevers, chills or abnormal night sweats Eyes: Denies blurriness of vision, double vision or watery eyes Ears, nose, mouth, throat, and face: Denies mucositis or sore throat Respiratory: Denies cough, dyspnea or wheezes Cardiovascular: Denies palpitation, chest discomfort or lower extremity swelling Gastrointestinal:  Denies nausea, heartburn or change in bowel habits Skin: Denies abnormal skin rashes Lymphatics: Denies new lymphadenopathy or easy bruising Neurological:Denies numbness, tingling or new weaknesses Behavioral/Psych: Mood is stable, no new changes  All other systems were reviewed with the patient and are negative.  PHYSICAL EXAMINATION: ECOG PERFORMANCE STATUS: 2 - Symptomatic, <50% confined to bed  Vitals:   02/29/16 1148  BP: (!) 152/54  Pulse: 65  Resp: 17  Temp: 98.6 F (37 C)   Filed Weights   02/29/16 1148  Weight: 173 lb 6.4 oz (78.7 kg)    GENERAL:alert, no distress and comfortable SKIN: skin color, texture, turgor are normal, no rashes or significant lesions EYES: normal, conjunctiva are pink and non-injected, sclera clear OROPHARYNX:no exudate, no erythema and lips, buccal mucosa, and tongue normal  NECK: supple, thyroid normal size, non-tender, without nodularity LYMPH:  no palpable lymphadenopathy in the cervical, axillary or inguinal LUNGS: clear to auscultation and percussion with normal breathing effort HEART: regular rate & rhythm and no murmurs and  no lower extremity edema ABDOMEN:abdomen soft, non-tender and normal bowel sounds Musculoskeletal:no cyanosis of digits and no clubbing  PSYCH: alert & oriented x 3 with fluent speech NEURO: no focal motor/sensory deficits  LABORATORY DATA:  I have reviewed the data as listed CBC Latest Ref Rng & Units 02/29/2016 12/02/2015 10/06/2015  WBC 4.0 - 10.3 10e3/uL 6.6 8.1 8.9  Hemoglobin 13.0 - 17.1 g/dL 11.9(L) 11.8(L) 11.9(L)  Hematocrit 38.4 - 49.9 % 37.5(L) 36.8(L) 37.4(L)  Platelets 140 - 400 10e3/uL 282 266 282   CMP Latest Ref Rng & Units 02/29/2016 12/02/2015 10/06/2015  Glucose 70 - 140 mg/dl 80 92 89  BUN 7.0 - 26.0 mg/dL 22.8 22.5 26.5(H)  Creatinine 0.7 - 1.3 mg/dL 1.6(H) 1.7(H) 1.7(H)  Sodium 136 - 145 mEq/L 141 139 140  Potassium 3.5 - 5.1 mEq/L 4.5 4.6 4.8  Chloride 96 - 112 mEq/L - - -  CO2 22 - 29 mEq/L 24 25 28   Calcium 8.4 - 10.4 mg/dL 8.4 8.7 9.2  Total Protein 6.4 - 8.3 g/dL 6.4 6.4 6.9  Total Bilirubin 0.20 - 1.20 mg/dL 0.44 0.45 0.50  Alkaline Phos 40 - 150 U/L 89 87 87  AST 5 - 34 U/L 9 9 9   ALT 0-55 U/L U/L <6 <9 <9   PATHOLOGY REPORT  Colon, biopsy, recto-sigmoid mass 09/15/2015 ADENOCARCINOMA Microscopic Comment Diagnosis of malignancy has been confirmed with Dr. Lyndon Code.  RADIOGRAPHIC STUDIES: I have personally reviewed the radiological images as listed and agreed with the findings in the report. No results found. Ct ABDOMEN AND PELVIS W CONTRAST 06/20/2015 IMPRESSION: Changes consistent with a colonic neoplasm at the rectosigmoid and till proven otherwise. Direct visualization is recommended.  Right adrenal lesion likely representing an adenoma.  Bilateral nonobstructing renal stones.  Partial obstruction of the right ureter secondary to the changes in the rectosigmoid region. No stone is identified.  These results will be called to the ordering clinician or representative by the Radiologist Assistant, and communication documented in the PACS or  zVision Dashboard.  FLEXIBLE SIGMOIDOSCOPY 09/15/2015 Malignant partially obstructing tumor in the recto-sigmoid colon. Biopsied. - The examination was otherwise normal.   ASSESSMENT & PLAN: 82 year old Caucasian male with past medical history of peripheral neuropathy on feet, hearing loss, otherwise healthy, but not very active, presented with chronic diarrhea for 4 years and episodic rectal bleeding for 4 months.  1. Rectosigmoid colon cancer, [id number] -I reviewed his biopsy and CT scan findings. -He CT chest abdomen and pelvis was negative for distant metastasis. -He has bilateral renal stone, and the right ureter obstruction secondary to the rectosigmoid colon cancer, and the renal impairment with creatinine 1.7 -He has seen urologist, but has not decided about stent placement.  -We discussed that surgery is the standard and only curative treatment option. Due to his advanced age, patient and his family members are very concerned about his risk of surgery, and the potential impact on his quality of life after surgery, which is understandable and reasonable. -I recommended him to follow-up with ductal Carlean Purl to see if it's possible to have a stent in rectosigmoid colon to improve his partial obstruction symptoms from tumor, he has an appointment soon.  -We discussed the role of radiation, which is likely to be palliative, for bleeding or obstruction. -We discussed the role of systemic chemotherapy, which will be palliative. Given his advanced age and the limited performance status, I would not recommend chemotherapy, I think the side effects is overweight the benefit, because quality of life is more important at his age. -We discussed the other options of systemic therapy, such as immunotherapy or EGFR inhibitor, if his tumor has certain genomic features. I will reserve this if he develops metastatic disease.  -We reviewed the natural course of colon cancer. Some colon cancer are relatively  indolent, he has had diarrhea for 3-4 years, his colon cancer may be indolent, may not metastasize very soon.  -Dr. Carlean Purl has deferred colonic stent placement - he is clinically stable, no significant change in the past 4 months. Patient's wife does not want a follow-up appointment at this point, and will call us if she has any concerns. We reviewed symptoms of cancer progression.   2.  Anemia -He has mild anemia. - his iron level has improved. He will continue oral ferrous sulfate.   3. CKD  -Likely secondary to obstructive uropathy from his colon cancer -His, see urologist soon.  Plan -Patient's wife does not want follow-up appointment at this point, will call us if needed in the future.   All questions were answered. The patient knows to call the clinic with any problems, questions or concerns.  I spent 15 minutes counseling the patient face to face. The total time spent in the appointment was 20 minutes and more than 50% was on counseling.     Truitt Merle, MD 02/29/2016 12:08 PM

## 2016-03-01 DIAGNOSIS — N200 Calculus of kidney: Secondary | ICD-10-CM | POA: Diagnosis not present

## 2016-03-01 DIAGNOSIS — N183 Chronic kidney disease, stage 3 (moderate): Secondary | ICD-10-CM | POA: Diagnosis not present

## 2016-03-01 DIAGNOSIS — N1330 Unspecified hydronephrosis: Secondary | ICD-10-CM | POA: Diagnosis not present

## 2016-03-01 DIAGNOSIS — N281 Cyst of kidney, acquired: Secondary | ICD-10-CM | POA: Diagnosis not present

## 2016-03-04 ENCOUNTER — Encounter: Payer: Self-pay | Admitting: Hematology

## 2016-03-19 ENCOUNTER — Ambulatory Visit (INDEPENDENT_AMBULATORY_CARE_PROVIDER_SITE_OTHER): Payer: Medicare Other | Admitting: Internal Medicine

## 2016-03-19 ENCOUNTER — Encounter: Payer: Self-pay | Admitting: Internal Medicine

## 2016-03-19 DIAGNOSIS — K56699 Other intestinal obstruction unspecified as to partial versus complete obstruction: Secondary | ICD-10-CM | POA: Diagnosis not present

## 2016-03-19 DIAGNOSIS — C186 Malignant neoplasm of descending colon: Secondary | ICD-10-CM

## 2016-03-19 NOTE — Progress Notes (Signed)
   Tommy Rivera 82 y.o. 1922-01-21 [id number]  Assessment & Plan:   1. Stricture of sigmoid colon   2. Cancer of left colon [facility])    He seems stable at this time. He will continue loperamide we reviewed the signs and symptoms of worsening obstruction. I will plan to see him in 3 months for a follow-up, they will call for an appointment. I don't see any reason for a colonic stent at this time. He did see urology who did not think that it would cause obstruction of his ureter if placed. Quality of life maintenance is cold. I told him I do not see why he cannot continue having his nightly bourbon and Coke that had been restricted by his family because of concerns of high fructose corn syrup influencing cancer. I appreciate the opportunity to care for this patient.   Subjective:   Chief Complaint: Colon cancer with stricture  HPI The patient is seen with his wife and daughter and reports that his quality of life is the same as it has been over the last 6 months. He'll take 1 loperamide one night and then to the next and that controls diarrhea to where he does not have urgent defecation and accidents. Denies any pain. His family restricted and stopped him from taking bourbon and Coke over concerns as outlined in the assessment and plan.  Wt Readings from Last 3 Encounters:  03/19/16 171 lb (77.6 kg)  02/29/16 173 lb 6.4 oz (78.7 kg)  01/02/16 176 lb 8 oz (80.1 kg)   Medications, allergies, past medical history, past surgical history, family history and social history are reviewed and updated in the EMR.    Review of Systems As above moves slowly uses a walking stick hard of hearing  Objective:   Physical Exam BP (!) 112/54   Pulse 60   Ht 5\' 5"  (1.651 m)   Wt 171 lb (77.6 kg)   BMI 28.46 kg/m  No acute distress Abdomen is protuberant soft tympanitic moderately distended without mass or tenderness. Bowel sounds are present.  15 minutes time spent with patient > half in  counseling coordination of care

## 2016-03-19 NOTE — Assessment & Plan Note (Signed)
Symptomatically stable.

## 2016-03-19 NOTE — Assessment & Plan Note (Signed)
Stable

## 2016-03-19 NOTE — Patient Instructions (Signed)
If you are age 80 or older, your body mass index should be between 23-30. Your Body mass index is 28.46 kg/m. If this is out of the aforementioned range listed, please consider follow up with your Primary Care Provider.  If you are age 79 or younger, your body mass index should be between 19-25. Your Body mass index is 28.46 kg/m. If this is out of the aformentioned range listed, please consider follow up with your Primary Care Provider.   Please follow up in 3 months with Dr Carlean Purl. Sooner if having problems. You will need to contact the office in 2 months to have this appointment scheduled.  Dr Carlean Purl says that you may enjoy your Rulon Eisenmenger and Coke!

## 2016-03-26 DIAGNOSIS — G629 Polyneuropathy, unspecified: Secondary | ICD-10-CM | POA: Diagnosis not present

## 2016-03-30 ENCOUNTER — Encounter (HOSPITAL_COMMUNITY): Payer: Self-pay | Admitting: Emergency Medicine

## 2016-03-30 ENCOUNTER — Emergency Department (HOSPITAL_COMMUNITY): Payer: Medicare Other

## 2016-03-30 ENCOUNTER — Emergency Department (HOSPITAL_COMMUNITY)
Admission: EM | Admit: 2016-03-30 | Discharge: 2016-03-30 | Disposition: A | Discharge status: Home / Self Care | Payer: Medicare Other | Attending: Emergency Medicine | Admitting: Emergency Medicine

## 2016-03-30 DIAGNOSIS — N183 Chronic kidney disease, stage 3 (moderate): Secondary | ICD-10-CM | POA: Insufficient documentation

## 2016-03-30 DIAGNOSIS — N3090 Cystitis, unspecified without hematuria: Secondary | ICD-10-CM | POA: Diagnosis not present

## 2016-03-30 DIAGNOSIS — R509 Fever, unspecified: Secondary | ICD-10-CM | POA: Diagnosis not present

## 2016-03-30 DIAGNOSIS — Z79899 Other long term (current) drug therapy: Secondary | ICD-10-CM | POA: Insufficient documentation

## 2016-03-30 DIAGNOSIS — Z87891 Personal history of nicotine dependence: Secondary | ICD-10-CM | POA: Insufficient documentation

## 2016-03-30 DIAGNOSIS — R319 Hematuria, unspecified: Secondary | ICD-10-CM | POA: Diagnosis present

## 2016-03-30 DIAGNOSIS — Z85038 Personal history of other malignant neoplasm of large intestine: Secondary | ICD-10-CM | POA: Diagnosis not present

## 2016-03-30 DIAGNOSIS — B9689 Other specified bacterial agents as the cause of diseases classified elsewhere: Secondary | ICD-10-CM | POA: Diagnosis not present

## 2016-03-30 LAB — URINALYSIS, ROUTINE W REFLEX MICROSCOPIC
BILIRUBIN URINE: NEGATIVE
GLUCOSE, UA: NEGATIVE mg/dL
Ketones, ur: NEGATIVE mg/dL
Nitrite: NEGATIVE
Protein, ur: 100 mg/dL — AB
SPECIFIC GRAVITY, URINE: 1.017 (ref 1.005–1.030)
pH: 6 (ref 5.0–8.0)

## 2016-03-30 LAB — CBC WITH DIFFERENTIAL/PLATELET
Basophils Absolute: 0 10*3/uL (ref 0.0–0.1)
Basophils Relative: 0 %
EOS ABS: 0.3 10*3/uL (ref 0.0–0.7)
EOS PCT: 2 %
HCT: 34.9 % — ABNORMAL LOW (ref 39.0–52.0)
HEMOGLOBIN: 11.1 g/dL — AB (ref 13.0–17.0)
LYMPHS ABS: 0.7 10*3/uL (ref 0.7–4.0)
Lymphocytes Relative: 4 %
MCH: 29 pg (ref 26.0–34.0)
MCHC: 31.8 g/dL (ref 30.0–36.0)
MCV: 91.1 fL (ref 78.0–100.0)
MONOS PCT: 8 %
Monocytes Absolute: 1.2 10*3/uL — ABNORMAL HIGH (ref 0.1–1.0)
Neutro Abs: 14.4 10*3/uL — ABNORMAL HIGH (ref 1.7–7.7)
Neutrophils Relative %: 86 %
PLATELETS: 174 10*3/uL (ref 150–400)
RBC: 3.83 MIL/uL — ABNORMAL LOW (ref 4.22–5.81)
RDW: 15.2 % (ref 11.5–15.5)
WBC: 16.6 10*3/uL — ABNORMAL HIGH (ref 4.0–10.5)

## 2016-03-30 LAB — COMPREHENSIVE METABOLIC PANEL
ALT: 12 U/L — AB (ref 17–63)
AST: 18 U/L (ref 15–41)
Albumin: 2.9 g/dL — ABNORMAL LOW (ref 3.5–5.0)
Alkaline Phosphatase: 88 U/L (ref 38–126)
Anion gap: 8 (ref 5–15)
BUN: 46 mg/dL — AB (ref 6–20)
CHLORIDE: 105 mmol/L (ref 101–111)
CO2: 23 mmol/L (ref 22–32)
CREATININE: 1.97 mg/dL — AB (ref 0.61–1.24)
Calcium: 8 mg/dL — ABNORMAL LOW (ref 8.9–10.3)
GFR calc Af Amer: 32 mL/min — ABNORMAL LOW (ref >60)
GFR calc non Af Amer: 28 mL/min — ABNORMAL LOW (ref >60)
GLUCOSE: 84 mg/dL (ref 65–99)
Potassium: 5 mmol/L (ref 3.5–5.1)
SODIUM: 136 mmol/L (ref 135–145)
Total Bilirubin: 1.2 mg/dL (ref 0.3–1.2)
Total Protein: 6 g/dL — ABNORMAL LOW (ref 6.5–8.1)

## 2016-03-30 LAB — I-STAT CG4 LACTIC ACID, ED
LACTIC ACID, VENOUS: 0.89 mmol/L (ref 0.5–1.9)
LACTIC ACID, VENOUS: 0.59 mmol/L (ref 0.5–1.9)

## 2016-03-30 LAB — URINE MICROSCOPIC-ADD ON

## 2016-03-30 MED ORDER — CEPHALEXIN 500 MG PO CAPS: 500.0000 mg | ORAL_CAPSULE | Freq: Four times a day (QID) | ORAL | 0 refills | Status: DC

## 2016-03-30 MED ORDER — CEPHALEXIN 500 MG PO CAPS
500.0000 mg | ORAL_CAPSULE | Freq: Once | ORAL | Status: AC
  Administered 2016-03-30: 500 mg via ORAL
  Filled 2016-03-30: qty 1

## 2016-03-30 NOTE — ED Triage Notes (Addendum)
Per family pt little urine output with blood onset this morning. Pt hx of same related to left kidney comprised with hx of colon cancer. Pt told to come here per urologist related to decrease in output and subjective fever/chills Tuesday.

## 2016-03-30 NOTE — Discharge Instructions (Signed)
Get plenty of rest and drink a lot of fluids.  Start the antibiotic prescription and take the first pill tonight before bedtime.  Return here, if needed, for problems.

## 2016-03-30 NOTE — ED Notes (Signed)
Bed: WA17 Expected date:  Expected time:  Means of arrival:  Comments: Res A 

## 2016-03-30 NOTE — ED Provider Notes (Signed)
[facility] DEPT Provider Note   CSN: [id number] Arrival date & time: 03/30/16  1131     History   Chief Complaint Chief Complaint  Patient presents with  . Hematuria  . Cancer    HPI Tommy Rivera is a 82 y.o. male.  He presents for evaluation of decreased urinary output, and blood in urine, both which started this morning. He also fell from a seated position, on the commode, 2 nights ago, but did not sustain any injuries. Yesterday he was able to ambulate, go to his daughter's home, and eat a Thanksgiving dinner. He did not eat this morning because he had several episodes of bloody urine, so wanted to come to be evaluated, before eating. He is being followed for colon cancer, and lives with his wife in their home. There are no other known modifying factors.  HPI  Past Medical History:  Diagnosis Date  . Anemia   . Brown recluse spider bite   . Cancer ([place])    colon  . Depression   . History of blood transfusion   . Neuropathy ([place])   . 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 Hydronephrosis of right kidney 01/02/2016  . Anemia, iron deficiency 12/15/2015  . CKD (chronic kidney disease), stage III 12/15/2015  . Cancer of left colon ([place]) 09/15/2015    Past Surgical History:  Procedure Laterality Date  . CATARACT EXTRACTION    . PROSTATE SURGERY  2001       Home Medications    Prior to Admission medications   Medication Sig Start Date End Date Taking? Authorizing Provider  gabapentin (NEURONTIN) 600 MG tablet Take 600 mg by mouth at bedtime.    Yes Historical Provider, MD  loperamide (IMODIUM A-D) 2 MG tablet Take 2 mg by mouth at bedtime. Take 2mg  tablet once or twice a day as needed for diarrhea   Yes Historical Provider, MD  NON FORMULARY My community mushroom packet -  Herbal med.   Yes Historical Provider, MD  cephALEXin (KEFLEX) 500 MG capsule Take 1  capsule (500 mg total) by mouth 4 (four) times daily. 03/30/16   Daleen Bo, MD    Family History Family History  Problem Relation Age of Onset  . Heart defect Mother   . Breast cancer Daughter   . Cancer Daughter 42    breast cancer  . Cancer Daughter 57    breast cancer   . Cancer Cousin     prostate cancer     Social History Social History  Substance Use Topics  . Smoking status: Former Smoker    Packs/day: 1.00    Years: 20.00    Types: Cigarettes    Quit date: 05/07/1964  . Smokeless tobacco: Never Used  . Alcohol use 0.6 oz/week    1 Shots of liquor per week     Comment: he used to drink daily, not much lately      Allergies   Patient has no known allergies.   Review of Systems Review of Systems  All other systems reviewed and are negative.    Physical Exam Updated Vital Signs BP (!) 136/47   Pulse (!) 59   Temp 98.2 F (36.8 C)   Resp 16   Ht 5\' 5"  (1.651 m)   Wt 175 lb (79.4 kg)   SpO2 98%   BMI 29.12 kg/m   Physical Exam  Constitutional: He is oriented to person, place, and time. He appears well-developed.  Elderly, frail  HENT:  Head: Normocephalic and atraumatic.  Right Ear: External ear normal.  Left Ear: External ear normal.  Eyes: Conjunctivae and EOM are normal. Pupils are equal, round, and reactive to light.  Neck: Normal range of motion and phonation normal. Neck supple.  Cardiovascular: Normal rate, regular rhythm and normal heart sounds.   Pulmonary/Chest: Effort normal and breath sounds normal. He exhibits no bony tenderness.  Abdominal: Soft. He exhibits no mass. There is no tenderness (Suprapubic, mild, without palpable mass.).  Musculoskeletal: Normal range of motion.  Neurological: He is alert and oriented to person, place, and time. No cranial nerve deficit or sensory deficit. He exhibits normal muscle tone. Coordination normal.  Skin: Skin is warm, dry and intact.  Psychiatric: He has a normal mood and affect. His  behavior is normal. Judgment and thought content normal.  Nursing note and vitals reviewed.    ED Treatments / Results  Labs (all labs ordered are listed, but only abnormal results are displayed) Labs Reviewed  COMPREHENSIVE METABOLIC PANEL - Abnormal; Notable for the following:       Result Value   BUN 46 (*)    Creatinine, Ser 1.97 (*)    Calcium 8.0 (*)    Total Protein 6.0 (*)    Albumin 2.9 (*)    ALT 12 (*)    GFR calc non Af Amer 28 (*)    GFR calc Af Amer 32 (*)    All other components within normal limits  CBC WITH DIFFERENTIAL/PLATELET - Abnormal; Notable for the following:    WBC 16.6 (*)    RBC 3.83 (*)    Hemoglobin 11.1 (*)    HCT 34.9 (*)    Neutro Abs 14.4 (*)    Monocytes Absolute 1.2 (*)    All other components within normal limits  URINALYSIS, ROUTINE W REFLEX MICROSCOPIC (NOT AT [facility]) - Abnormal; Notable for the following:    APPearance TURBID (*)    Hgb urine dipstick LARGE (*)    Protein, ur 100 (*)    Leukocytes, UA LARGE (*)    All other components within normal limits  URINE MICROSCOPIC-ADD ON - Abnormal; Notable for the following:    Squamous Epithelial / LPF 0-5 (*)    Bacteria, UA MANY (*)    All other components within normal limits  I-STAT CG4 LACTIC ACID, ED  I-STAT CG4 LACTIC ACID, ED  I-STAT CG4 LACTIC ACID, ED    EKG  EKG Interpretation None       Radiology Dg Chest 2 View  Result Date: 03/30/2016 CLINICAL DATA:  Fever EXAM: CHEST  2 VIEW COMPARISON:  09/23/2015 chest CT FINDINGS: Top-normal heart size. Aortic atherosclerosis. Otherwise normal mediastinal contour. No pneumothorax. No pleural effusion. No pulmonary edema. No acute consolidative airspace disease. IMPRESSION: No active cardiopulmonary disease.  Aortic atherosclerosis. Electronically Signed   By: Ilona Sorrel M.D.   On: 03/30/2016 12:53    Procedures Procedures (including critical care time)  Medications Ordered in ED Medications  cephALEXin (KEFLEX) capsule  500 mg (500 mg Oral Given 03/30/16 1553)     Initial Impression / Assessment and Plan / ED Course  I have reviewed the triage vital signs and the nursing notes.  Pertinent labs & imaging results that were available during my care of the patient were reviewed by me and considered in my medical decision making (see chart for details).  Clinical Course  Medications  cephALEXin (KEFLEX) capsule 500 mg (500 mg Oral Given 03/30/16 1553)    Patient Vitals for the past 24 hrs:  BP Temp Pulse Resp SpO2 Height Weight  03/30/16 1515 (!) 136/47 - (!) 59 - 98 % - -  03/30/16 1453 (!) 129/51 - 65 16 97 % - -  03/30/16 1430 (!) 129/51 - 62 - 97 % - -  03/30/16 1333 145/56 - 65 14 94 % - -  03/30/16 1230 (!) 140/46 - 66 - 96 % - -  03/30/16 1224 (!) 132/46 - 60 - 98 % - -  03/30/16 1151 - - - - - 5\' 5"  (1.651 m) 175 lb (79.4 kg)  03/30/16 1148 (!) 134/48 98.2 F (36.8 C) 78 16 97 % - -    3:55 PM Reevaluation with update and discussion. After initial assessment and treatment, an updated evaluation reveals He is comfortable now. Findings discussed with patient and family members, all questions answered. Reanna Scoggin L    Final Clinical Impressions(s) / ED Diagnoses   Final diagnoses:  Cystitis    Evaluation consistent with urinary tract infection, elderly patient, with mild hematuria. No evidence for bladder outlet obstruction. Doubt serious bacterial infection. Metabolic instability or impending vascular collapse.  Nursing Notes Reviewed/ Care Coordinated Applicable Imaging Reviewed Interpretation of Laboratory Data incorporated into ED treatment  The patient appears reasonably screened and/or stabilized for discharge and I doubt any other medical condition or other [facility] requiring further screening, evaluation, or treatment in the ED at this time prior to discharge.  Plan: Home Medications- continue; Home Treatments- rest, fluids; return here if the recommended treatment, does not  improve the symptoms; Recommended follow up- PCP 1 week  New Prescriptions New Prescriptions   CEPHALEXIN (KEFLEX) 500 MG CAPSULE    Take 1 capsule (500 mg total) by mouth 4 (four) times daily.     Daleen Bo, MD 03/30/16 [phone]

## 2016-08-20 ENCOUNTER — Telehealth: Payer: Self-pay | Admitting: Internal Medicine

## 2016-08-20 NOTE — Telephone Encounter (Signed)
Patient's daughter reports that her father is having rectal bleeding and diarrhea that is getting progressively worse.  Daughter says he is not able to leave the house due to the bleeding and diarrhea.  Patient has a history of colon cancer. He will come in and see Dr. Carlean Purl on 08/23/16 at 10:00

## 2016-08-23 ENCOUNTER — Ambulatory Visit (INDEPENDENT_AMBULATORY_CARE_PROVIDER_SITE_OTHER): Payer: Medicare Other | Admitting: Internal Medicine

## 2016-08-23 ENCOUNTER — Other Ambulatory Visit (INDEPENDENT_AMBULATORY_CARE_PROVIDER_SITE_OTHER): Payer: Medicare Other

## 2016-08-23 ENCOUNTER — Encounter: Payer: Self-pay | Admitting: Internal Medicine

## 2016-08-23 VITALS — BP 144/50 | HR 80 | Ht 65.0 in | Wt 158.4 lb

## 2016-08-23 DIAGNOSIS — C186 Malignant neoplasm of descending colon: Secondary | ICD-10-CM

## 2016-08-23 DIAGNOSIS — K56699 Other intestinal obstruction unspecified as to partial versus complete obstruction: Secondary | ICD-10-CM | POA: Diagnosis not present

## 2016-08-23 DIAGNOSIS — K921 Melena: Secondary | ICD-10-CM

## 2016-08-23 DIAGNOSIS — D500 Iron deficiency anemia secondary to blood loss (chronic): Secondary | ICD-10-CM

## 2016-08-23 LAB — COMPREHENSIVE METABOLIC PANEL
ALBUMIN: 3.6 g/dL (ref 3.5–5.2)
ALK PHOS: 83 U/L (ref 39–117)
ALT: 6 U/L (ref 0–53)
AST: 10 U/L (ref 0–37)
BILIRUBIN TOTAL: 0.3 mg/dL (ref 0.2–1.2)
BUN: 25 mg/dL — ABNORMAL HIGH (ref 6–23)
CALCIUM: 9.1 mg/dL (ref 8.4–10.5)
CO2: 30 mEq/L (ref 19–32)
CREATININE: 1.5 mg/dL (ref 0.40–1.50)
Chloride: 106 mEq/L (ref 96–112)
GFR: 46.42 mL/min — ABNORMAL LOW (ref >60.00)
Glucose, Bld: 85 mg/dL (ref 70–99)
Potassium: 4.5 mEq/L (ref 3.5–5.1)
Sodium: 141 mEq/L (ref 135–145)
TOTAL PROTEIN: 7.1 g/dL (ref 6.0–8.3)

## 2016-08-23 LAB — CBC WITH DIFFERENTIAL/PLATELET
BASOS ABS: 0.1 10*3/uL (ref 0.0–0.1)
Basophils Relative: 0.7 % (ref 0.0–3.0)
EOS ABS: 0.5 10*3/uL (ref 0.0–0.7)
Eosinophils Relative: 4 % (ref 0.0–5.0)
HEMATOCRIT: 36.4 % — AB (ref 39.0–52.0)
Hemoglobin: 11.5 g/dL — ABNORMAL LOW (ref 13.0–17.0)
LYMPHS PCT: 10.1 % — AB (ref 12.0–46.0)
Lymphs Abs: 1.2 10*3/uL (ref 0.7–4.0)
MCHC: 31.6 g/dL (ref 30.0–36.0)
MCV: 87.2 fl (ref 78.0–100.0)
MONO ABS: 0.9 10*3/uL (ref 0.1–1.0)
Monocytes Relative: 8.3 % (ref 3.0–12.0)
Neutro Abs: 8.8 10*3/uL — ABNORMAL HIGH (ref 1.4–7.7)
Neutrophils Relative %: 76.9 % (ref 43.0–77.0)
Platelets: 339 10*3/uL (ref 150.0–400.0)
RBC: 4.18 Mil/uL — ABNORMAL LOW (ref 4.22–5.81)
RDW: 15.7 % — ABNORMAL HIGH (ref 11.5–15.5)
WBC: 11.5 10*3/uL — ABNORMAL HIGH (ref 4.0–10.5)

## 2016-08-23 NOTE — Patient Instructions (Signed)
Your physician has requested that you go to the basement for the following lab work before leaving today: CBC/diff, CMET   You have been scheduled for a CT scan of the abdomen and pelvis at [facility] ([address]---this is in the same building as Press [facility]).   You are scheduled on 08/31/16 at 9:30AM. You should arrive 15 minutes prior to your appointment time for registration. Please follow the written instructions below on the day of your exam:  WARNING: IF YOU ARE ALLERGIC TO IODINE/X-RAY DYE, PLEASE NOTIFY RADIOLOGY IMMEDIATELY AT [phone]! YOU WILL BE GIVEN A 13 HOUR PREMEDICATION PREP.  1) Do not eat or drink anything after 5:30AM (4 hours prior to your test) 2) You have been given 2 bottles of oral contrast to drink. The solution may taste  better if refrigerated, but do NOT add ice or any other liquid to this solution. Shake  well before drinking.    Drink 1 bottle of contrast @ 7:30AM (2 hours prior to your exam)  Drink 1 bottle of contrast @ 8:30AM (1 hour prior to your exam)  You may take any medications as prescribed with a small amount of water except for the following: Metformin, Glucophage, Glucovance, Avandamet, Riomet, Fortamet, Actoplus Met, Janumet, Glumetza or Metaglip. The above medications must be held the day of the exam AND 48 hours after the exam.  The purpose of you drinking the oral contrast is to aid in the visualization of your intestinal tract. The contrast solution may cause some diarrhea. Before your exam is started, you will be given a small amount of fluid to drink. Depending on your individual set of symptoms, you may also receive an intravenous injection of x-ray contrast/dye. Plan on being at [facility] for 30 minutes or longer, depending on the type of exam you are having performed.  This test typically takes 30-45 minutes to complete.  If you have any questions regarding your exam or if you need to reschedule, you may  call the CT department at [phone] between the hours of 8:00 am and 5:00 pm, Monday-Friday.  ________________________________________________________________________   I appreciate the opportunity to care for you. Silvano Rusk, MD, [facility]

## 2016-08-23 NOTE — Assessment & Plan Note (Signed)
Sounds a bit more symptomatic

## 2016-08-23 NOTE — Progress Notes (Signed)
Tommy Rivera 83 y.o. May 07, 1922 [id number]  Assessment & Plan:   Encounter Diagnoses  Name Primary?  . Cancer of left colon ([place])   . 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 Iron deficiency anemia due to chronic blood loss   . Blood in stool Yes   Is worse but overall he is tolerating things very well but may be having some overflow type diarrhea. He may be getting to the point where stenting this cancer and stricture makes sense. The bleeding should be coming from the tumor as well it does not seem to be high volume. I checked a CBC and the results are not that far off. Really stable overall so low volume bleeding. I'm going to repeat a CT scan to get an overall picture of where things are and am considering placement of a palliative colonic stent.  CBC Latest Ref Rng & Units 08/23/2016 03/30/2016 02/29/2016  WBC 4.0 - 10.5 K/uL 11.5(H) 16.6(H) 6.6  Hemoglobin 13.0 - 17.0 g/dL 11.5(L) 11.1(L) 11.9(L)  Hematocrit 39.0 - 52.0 % 36.4(L) 34.9(L) 37.5(L)  Platelets 150.0 - 400.0 K/uL 339.0 174 282     Subjective:   Chief Complaint: Rectal bleeding  HPI The patient is here with his wife and daughter because of follow-up of a known stricturing sigmoid colon cancer that was diagnosed in early 2017. I last saw him in November. He reports intermittent but pretty persistent rectal bleeding on the toilet paper and some in the commode usually bright red. He tends to have diarrhea and he is managing that would loperamide overall. He does wear diapers just in case. He has a hard time making it to the bathroom in time sometimes. Overall though he is continued to have a decent quality of life it sounds like, he is walking, living pretty normal life as one could 34 age 47 though he misses his home in [place].  Allergies  Allergen Reactions  . Keflex [Cephalexin] Rash   Current Meds  Medication Sig  . Boswellia Serrata (BOSWELLIA PO) Take 1 tablet by mouth daily.  Marland Kitchen gabapentin (NEURONTIN) 600 MG  tablet Take 600 mg by mouth at bedtime.   Marland Kitchen loperamide (IMODIUM A-D) 2 MG tablet Take 2 mg by mouth at bedtime. Take 2mg  tablet once or twice a day as needed for diarrhea  . NON FORMULARY My community mushroom packet -  Herbal med.   Past Medical History:  Diagnosis Date  . Anemia   . Brown recluse spider bite   . Colon cancer ([place])   . Depression   . History of blood transfusion   . Neuropathy   . 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 CATARACT EXTRACTION    . PROSTATE SURGERY  2001   Social History   Social History  . Marital status: Married    Spouse name: N/A  . Number of children: 2  . Years of education: N/A   Occupational History  . retired    Social History Main Topics  . Smoking status: Former Smoker    Packs/day: 1.00    Years: 20.00    Types: Cigarettes    Quit date: 05/07/1964  . Smokeless tobacco: Never Used  . Alcohol use 0.6 oz/week    1 Shots of liquor per week     Comment: he used to drink daily, not much lately   . Drug use: No  . Sexual activity: Not Asked   Other  Topics Concern  . None   Social History Narrative   Married, wife Tessie Fass - retired [employer] [place] here to [employer] 2015-16--says he misses [place]   Was in the [employer] for 6 years   Has a daughter in [place] -His other daughter died from breast cancer    family history includes Breast cancer in his daughter; Cancer in his cousin; Cancer (age of onset: 73) in his daughter and daughter; Heart defect in his mother.  Review of Systems As above  Objective:   Physical Exam BP (!) 144/50 (BP Location: Left Arm, Patient Position: Sitting, Cuff Size: Normal)   Pulse 80   Ht 5\' 5"  (1.651 m) Comment: height measured without shoes  Wt 158 lb 6 oz (71.8 kg)   BMI 26.35 kg/m  Hard of hearing Elderly NAF Lungs cta Cor s1s2 no rmg abd diastasis recti, soft, protuberant mildly distended BS+ [place] Rectal - no mass  palpable, reddish/brown stool, formed Ext no cc, + 1 edemLE

## 2016-08-23 NOTE — Assessment & Plan Note (Signed)
Reassess w/ CT - considering colon stent

## 2016-08-23 NOTE — Assessment & Plan Note (Signed)
PE suggests reasonable Hgb recheck

## 2016-08-30 ENCOUNTER — Inpatient Hospital Stay: Admission: RE | Admit: 2016-08-30 | Payer: Medicare Other | Source: Ambulatory Visit

## 2016-08-30 NOTE — Progress Notes (Signed)
Labs show stable slightly low Hgb Kidney function should allow low-dose contrasted CT scan  Please schedule CT abdomen and pelvis with oral and IV contrast (lower dose w/ GFR 46) - I also want rectal contrast Evaluate sigmoid colon cancer

## 2016-08-31 ENCOUNTER — Inpatient Hospital Stay: Admission: RE | Admit: 2016-08-31 | Payer: Medicare Other | Source: Ambulatory Visit

## 2016-08-31 ENCOUNTER — Other Ambulatory Visit: Payer: Self-pay

## 2016-08-31 DIAGNOSIS — C187 Malignant neoplasm of sigmoid colon: Secondary | ICD-10-CM

## 2016-09-05 ENCOUNTER — Inpatient Hospital Stay: Admission: RE | Admit: 2016-09-05 | Payer: Medicare Other | Source: Ambulatory Visit

## 2016-09-11 ENCOUNTER — Telehealth: Payer: Self-pay | Admitting: Internal Medicine

## 2016-09-11 NOTE — Telephone Encounter (Signed)
Phone line busy.

## 2016-09-11 NOTE — Telephone Encounter (Signed)
One thing that comes to mind is that the cancer has infiltrated the liver - LFT's were normal though - goes against  Nothing in the labs suggest a cause as to why he is so lethargic  The CT scan would answer the top line  Maybe an ambulance transport or other medical transport service to help her get him to CT?  Extreme option is to go to ED (ambulance?)  Let me know please  Again overall have to think that the cancer is causing this and look for that  -

## 2016-09-11 NOTE — Telephone Encounter (Signed)
Wife reports that patient is almost always sleeping.  He wakes up to eat and drink.  He is making urine and is urinating about every 2-3 hours.  He ha no energy at all. Wife "am at my wits end" .  Wife reports that husband is discouraged by his lack of energy.  They deny rectal bleeding, fever, or other symptoms. She is asking if you have any idea of why he is so lethargic? She does not think she can even get him out of the house for an office visit, labs, or the CT that you recommended. See results on lb work from 08/23/16

## 2016-09-11 NOTE — Telephone Encounter (Signed)
I spoke to his wife, and after that I'm inclined to agree with her that he has probably depressed and this is mostly behavioral and psychiatric or psychological. The labs are so good I doubt he has widely metastatic disease. She says he has just given up and he admits that. I off urge to prescribe sertraline to try to help with depressive symptoms and she will discuss with daughter and let us know. She has stopped all of his medications and I think that make sense, he is not really had them in several days and none of them are essential.  I also broached hospice, and how that was a reasonable option and we could make a referral at any point though again he has done very well overall until now as far symptoms with the burden of disease but I think he is struggling with facing his mortality it sounds like. They certainly could help with counseling in that regard to.

## 2016-09-11 NOTE — Telephone Encounter (Signed)
Wife notified of the recommendations.  She still does not know if she wants to have the CT done. She questions if it is cancer causing this, what would the treatment be and do they even want to know. She will call me back if she wants to schedule the CT scan.,

## 2016-09-17 ENCOUNTER — Ambulatory Visit: Payer: Medicare Other | Admitting: Internal Medicine

## 2016-10-19 ENCOUNTER — Telehealth: Payer: Self-pay | Admitting: Internal Medicine

## 2016-10-19 ENCOUNTER — Telehealth: Payer: Self-pay | Admitting: *Deleted

## 2016-10-19 NOTE — Telephone Encounter (Signed)
I spoke with the patient's daughter.  She is concerned about her father.  She reports about a 30 lb. Weight loss since his office visit with you in April.  He has fallen 2 times in the last 2 days.  The wants to know if he should be admitted for "testing" she is convinced that he has a new problem and is also asking about rehab for him. I did review with her the note from May where you spoke with patient's wife.  She does not feel "he is dying" and doesn't want hospice.  They are wanting help with weight management and his rapid decline in weight as well as muscle mass.  Please advise

## 2016-10-19 NOTE — Telephone Encounter (Signed)
Daughter notified of the recommendations.

## 2016-10-19 NOTE — Telephone Encounter (Signed)
Received call from daughter Oda Kilts requesting a call back from nurse or Dr. Burr Medico.  Spoke with Olin Hauser and was informed that pt had lost 20 + lbs since last month.  Pt eating and drinking ok.  However, pt has dizzy episodes.  Fell  Twice within 24 hour period.   Unable to move around, unable to stand.  Family cannot lift pt out of bed nor chair. Olin Hauser wanted to know if Dr. Burr Medico would admit pt to the hospital for more testing. Instructed Olin Hauser since pt fell twice and not able to get out of bed - even with assistance,  Pt needs to go to ER for further evaluation.   Olin Hauser voiced understanding, and stated she would call ambulance to transport pt. Pamela's    Phone        [phone].

## 2016-10-19 NOTE — Telephone Encounter (Signed)
Its possible he has metastatic disease to the liver - thought that was possible before but CT scan was not done by their choice.  We could try to order an outpatient CT scan abd/pelvis again - would need BUN/Creat  If they want admission and evaluation they would need to go to ED for evaluation and consideration - should go to [facility] if they do

## 2016-10-19 NOTE — Telephone Encounter (Signed)
I agree with the plan, thanks.   Latrel Szymczak MD  

## 2016-10-22 ENCOUNTER — Inpatient Hospital Stay (HOSPITAL_COMMUNITY)
Admission: EM | Admit: 2016-10-22 | Discharge: 2016-10-25 | DRG: 689 | Disposition: A | Discharge status: Home / Self Care | Payer: Medicare Other | Attending: Family Medicine | Admitting: Family Medicine

## 2016-10-22 ENCOUNTER — Emergency Department (HOSPITAL_COMMUNITY): Payer: Medicare Other

## 2016-10-22 ENCOUNTER — Encounter (HOSPITAL_COMMUNITY): Payer: Self-pay | Admitting: *Deleted

## 2016-10-22 DIAGNOSIS — Z9181 History of falling: Secondary | ICD-10-CM | POA: Diagnosis not present

## 2016-10-22 DIAGNOSIS — C186 Malignant neoplasm of descending colon: Secondary | ICD-10-CM

## 2016-10-22 DIAGNOSIS — R9431 Abnormal electrocardiogram [ECG] [EKG]: Secondary | ICD-10-CM | POA: Diagnosis not present

## 2016-10-22 DIAGNOSIS — R5381 Other malaise: Secondary | ICD-10-CM | POA: Diagnosis not present

## 2016-10-22 DIAGNOSIS — E860 Dehydration: Secondary | ICD-10-CM | POA: Diagnosis present

## 2016-10-22 DIAGNOSIS — B9620 Unspecified Escherichia coli [E. coli] as the cause of diseases classified elsewhere: Secondary | ICD-10-CM | POA: Diagnosis present

## 2016-10-22 DIAGNOSIS — N200 Calculus of kidney: Secondary | ICD-10-CM | POA: Diagnosis not present

## 2016-10-22 DIAGNOSIS — R296 Repeated falls: Secondary | ICD-10-CM

## 2016-10-22 DIAGNOSIS — Z515 Encounter for palliative care: Secondary | ICD-10-CM | POA: Diagnosis not present

## 2016-10-22 DIAGNOSIS — N135 Crossing vessel and stricture of ureter without hydronephrosis: Secondary | ICD-10-CM

## 2016-10-22 DIAGNOSIS — R2681 Unsteadiness on feet: Secondary | ICD-10-CM | POA: Diagnosis not present

## 2016-10-22 DIAGNOSIS — N183 Chronic kidney disease, stage 3 unspecified: Secondary | ICD-10-CM | POA: Diagnosis present

## 2016-10-22 DIAGNOSIS — H9190 Unspecified hearing loss, unspecified ear: Secondary | ICD-10-CM

## 2016-10-22 DIAGNOSIS — R634 Abnormal weight loss: Secondary | ICD-10-CM

## 2016-10-22 DIAGNOSIS — N390 Urinary tract infection, site not specified: Principal | ICD-10-CM | POA: Diagnosis present

## 2016-10-22 DIAGNOSIS — C785 Secondary malignant neoplasm of large intestine and rectum: Secondary | ICD-10-CM | POA: Diagnosis not present

## 2016-10-22 DIAGNOSIS — C19 Malignant neoplasm of rectosigmoid junction: Secondary | ICD-10-CM | POA: Diagnosis present

## 2016-10-22 DIAGNOSIS — Z7189 Other specified counseling: Secondary | ICD-10-CM | POA: Diagnosis not present

## 2016-10-22 DIAGNOSIS — D649 Anemia, unspecified: Secondary | ICD-10-CM | POA: Diagnosis present

## 2016-10-22 DIAGNOSIS — D631 Anemia in chronic kidney disease: Secondary | ICD-10-CM | POA: Diagnosis present

## 2016-10-22 DIAGNOSIS — N3000 Acute cystitis without hematuria: Secondary | ICD-10-CM | POA: Diagnosis not present

## 2016-10-22 DIAGNOSIS — I509 Heart failure, unspecified: Secondary | ICD-10-CM | POA: Diagnosis not present

## 2016-10-22 DIAGNOSIS — C189 Malignant neoplasm of colon, unspecified: Secondary | ICD-10-CM | POA: Diagnosis not present

## 2016-10-22 DIAGNOSIS — Z881 Allergy status to other antibiotic agents status: Secondary | ICD-10-CM

## 2016-10-22 DIAGNOSIS — N132 Hydronephrosis with renal and ureteral calculous obstruction: Secondary | ICD-10-CM | POA: Diagnosis not present

## 2016-10-22 DIAGNOSIS — K219 Gastro-esophageal reflux disease without esophagitis: Secondary | ICD-10-CM | POA: Diagnosis not present

## 2016-10-22 DIAGNOSIS — E43 Unspecified severe protein-calorie malnutrition: Secondary | ICD-10-CM | POA: Diagnosis present

## 2016-10-22 DIAGNOSIS — D509 Iron deficiency anemia, unspecified: Secondary | ICD-10-CM | POA: Diagnosis not present

## 2016-10-22 DIAGNOSIS — R531 Weakness: Secondary | ICD-10-CM | POA: Diagnosis not present

## 2016-10-22 DIAGNOSIS — N1330 Unspecified hydronephrosis: Secondary | ICD-10-CM | POA: Diagnosis present

## 2016-10-22 DIAGNOSIS — R404 Transient alteration of awareness: Secondary | ICD-10-CM | POA: Diagnosis not present

## 2016-10-22 DIAGNOSIS — R52 Pain, unspecified: Secondary | ICD-10-CM | POA: Diagnosis not present

## 2016-10-22 DIAGNOSIS — M6281 Muscle weakness (generalized): Secondary | ICD-10-CM | POA: Diagnosis not present

## 2016-10-22 DIAGNOSIS — R4182 Altered mental status, unspecified: Secondary | ICD-10-CM | POA: Diagnosis not present

## 2016-10-22 DIAGNOSIS — Z66 Do not resuscitate: Secondary | ICD-10-CM | POA: Diagnosis present

## 2016-10-22 DIAGNOSIS — K5900 Constipation, unspecified: Secondary | ICD-10-CM | POA: Diagnosis not present

## 2016-10-22 DIAGNOSIS — Z6821 Body mass index (BMI) 21.0-21.9, adult: Secondary | ICD-10-CM

## 2016-10-22 LAB — I-STAT TROPONIN, ED: Troponin i, poc: 0.03 ng/mL (ref 0.00–0.08)

## 2016-10-22 LAB — COMPREHENSIVE METABOLIC PANEL
ALBUMIN: 2 g/dL — AB (ref 3.5–5.0)
ALK PHOS: 74 U/L (ref 38–126)
ALT: 11 U/L — AB (ref 17–63)
AST: 12 U/L — ABNORMAL LOW (ref 15–41)
Anion gap: 6 (ref 5–15)
BUN: 18 mg/dL (ref 6–20)
CALCIUM: 8.2 mg/dL — AB (ref 8.9–10.3)
CO2: 28 mmol/L (ref 22–32)
CREATININE: 1.5 mg/dL — AB (ref 0.61–1.24)
Chloride: 104 mmol/L (ref 101–111)
GFR calc Af Amer: 44 mL/min — ABNORMAL LOW (ref >60)
GFR calc non Af Amer: 38 mL/min — ABNORMAL LOW (ref >60)
GLUCOSE: 96 mg/dL (ref 65–99)
Potassium: 5 mmol/L (ref 3.5–5.1)
SODIUM: 138 mmol/L (ref 135–145)
Total Bilirubin: 0.5 mg/dL (ref 0.3–1.2)
Total Protein: 6 g/dL — ABNORMAL LOW (ref 6.5–8.1)

## 2016-10-22 LAB — URINALYSIS, ROUTINE W REFLEX MICROSCOPIC
Bilirubin Urine: NEGATIVE
Glucose, UA: NEGATIVE mg/dL
KETONES UR: NEGATIVE mg/dL
NITRITE: POSITIVE — AB
Protein, ur: 100 mg/dL — AB
Specific Gravity, Urine: 1.013 (ref 1.005–1.030)
pH: 6 (ref 5.0–8.0)

## 2016-10-22 LAB — CBC
HCT: 26.6 % — ABNORMAL LOW (ref 39.0–52.0)
HEMOGLOBIN: 8.4 g/dL — AB (ref 13.0–17.0)
MCH: 26.3 pg (ref 26.0–34.0)
MCHC: 31.6 g/dL (ref 30.0–36.0)
MCV: 83.4 fL (ref 78.0–100.0)
Platelets: 419 10*3/uL — ABNORMAL HIGH (ref 150–400)
RBC: 3.19 MIL/uL — AB (ref 4.22–5.81)
RDW: 15.9 % — ABNORMAL HIGH (ref 11.5–15.5)
WBC: 15 10*3/uL — ABNORMAL HIGH (ref 4.0–10.5)

## 2016-10-22 LAB — LIPASE, BLOOD: Lipase: 19 U/L (ref 11–51)

## 2016-10-22 MED ORDER — ACETAMINOPHEN 325 MG PO TABS
650.0000 mg | ORAL_TABLET | Freq: Four times a day (QID) | ORAL | Status: DC | PRN
  Administered 2016-10-25: 650 mg via ORAL
  Filled 2016-10-22: qty 2

## 2016-10-22 MED ORDER — CIPROFLOXACIN IN D5W 400 MG/200ML IV SOLN
400.0000 mg | Freq: Once | INTRAVENOUS | Status: AC
  Administered 2016-10-22: 400 mg via INTRAVENOUS
  Filled 2016-10-22: qty 200

## 2016-10-22 MED ORDER — SODIUM CHLORIDE 0.9 % IV SOLN
INTRAVENOUS | Status: AC
  Administered 2016-10-22 (×2): via INTRAVENOUS

## 2016-10-22 MED ORDER — GABAPENTIN 300 MG PO CAPS
300.0000 mg | ORAL_CAPSULE | Freq: Every day | ORAL | Status: DC
  Administered 2016-10-22 – 2016-10-24 (×3): 300 mg via ORAL
  Filled 2016-10-22 (×8): qty 1

## 2016-10-22 MED ORDER — OXYCODONE HCL 5 MG PO TABS: 5.0000 mg | ORAL_TABLET | ORAL | Status: DC | PRN

## 2016-10-22 MED ORDER — ONDANSETRON HCL 4 MG PO TABS
4.0000 mg | ORAL_TABLET | Freq: Four times a day (QID) | ORAL | Status: DC | PRN
Start: 2016-10-22 — End: 2016-10-25

## 2016-10-22 MED ORDER — GUAIFENESIN 100 MG/5ML PO SOLN: 5.0000 mL | ORAL | Status: DC | PRN

## 2016-10-22 MED ORDER — ONDANSETRON HCL 4 MG/2ML IJ SOLN: 4.0000 mg | Freq: Four times a day (QID) | INTRAMUSCULAR | Status: DC | PRN

## 2016-10-22 MED ORDER — CIPROFLOXACIN IN D5W 400 MG/200ML IV SOLN
400.0000 mg | INTRAVENOUS | Status: DC
  Administered 2016-10-23 – 2016-10-24 (×2): 400 mg via INTRAVENOUS
  Filled 2016-10-22 (×2): qty 200

## 2016-10-22 MED ORDER — IOPAMIDOL (ISOVUE-300) INJECTION 61%
100.0000 mL | Freq: Once | INTRAVENOUS | Status: AC | PRN
  Administered 2016-10-22: 60 mL via INTRAVENOUS

## 2016-10-22 MED ORDER — ACETAMINOPHEN 650 MG RE SUPP: 650.0000 mg | Freq: Four times a day (QID) | RECTAL | Status: DC | PRN

## 2016-10-22 MED ORDER — IOPAMIDOL (ISOVUE-300) INJECTION 61%
INTRAVENOUS | Status: AC
  Administered 2016-10-22: 60 mL via INTRAVENOUS
  Filled 2016-10-22: qty 100

## 2016-10-22 NOTE — ED Notes (Signed)
Family at bedside. 

## 2016-10-22 NOTE — ED Notes (Signed)
Bed: WA18 Expected date:  Expected time:  Means of arrival:  Comments: EMS-weakness 

## 2016-10-22 NOTE — Progress Notes (Signed)
Pharmacy Antibiotic Note  Tommy Rivera is a 83 y.o. male admitted on 10/22/2016 with UTI. PMH significant for metastatic colon cancer.  Cipro 400mg  IV x 1 dose given in ED.  Pharmacy has been consulted for Cipro dosing.  Plan: Cipro 400mg  IV q24h Follow cultures, renal function  Height: 5\' 7"  (170.2 cm) Weight: 139 lb 15.9 oz (63.5 kg) IBW/kg (Calculated) : 66.1  Temp (24hrs), Avg:98 F (36.7 C), Min:98 F (36.7 C), Max:98 F (36.7 C)   Recent Labs Lab 10/22/16 1156  WBC 15.0*  CREATININE 1.50*    Estimated Creatinine Clearance: 27.6 mL/min (A) (by C-G formula based on SCr of 1.5 mg/dL (H)).    Allergies  Allergen Reactions  . Keflex [Cephalexin] Rash    Antimicrobials this admission: 6/18 Cipro >>    Dose adjustments this admission:    Microbiology results: 6/18 UCx: sent   Thank you for allowing pharmacy to be a part of this patient's care.  Everette Rank, PharmD 10/22/2016 4:47 PM

## 2016-10-22 NOTE — ED Notes (Signed)
Bandages applied to PTs skin tears on left arm and forearm

## 2016-10-22 NOTE — ED Notes (Signed)
Consulting  Provider at bedside taking to family

## 2016-10-22 NOTE — ED Notes (Signed)
ED Provider at bedside. 

## 2016-10-22 NOTE — ED Notes (Signed)
Patient transported to X-ray 

## 2016-10-22 NOTE — H&P (Signed)
[facility] History and Physical  [name] LEX:[id number] DOB: [birth year] DOA: 10/22/2016   PCP: Patient, No Pcp Per  Specialists: Dr. Carlean Purl is his gastroenterologist. Dr. Burr Medico is his oncologist.  Chief Complaint: Weight loss, generalized weakness, frequent falls  HPI: Tommy Rivera is a 83 y.o. male with a past medical history of colon cancer, predominantly in the rectal area for which the patient elected no treatment/surgery. He otherwise does not have any significant health problems. He was brought in today by his wife and daughter, because patient has been falling more frequently at home. Most of these have been mechanical falls. Patient is very hard of hearing so history was somewhat limited. Lot of information was provided by the patient's wife and daughter. Patient has loose stools at baseline, which has blood in it due to his cancer. This hasn't changed much. Denies any difficulty with urinating. Denies any abdominal pain. Some nausea but no vomiting. But has had very poor appetite the last many weeks. He's lost more than 20 pounds in the last 2-3 months. Has not been able to ambulate today due to fatigue. Denies any pain in his legs.  In the emergency department, patient was found to be dehydrated. He had an abnormal UA. CT scan shows progression of his cancer.  Home Medications: Prior to Admission medications   Medication Sig Start Date End Date Taking? Authorizing Provider  gabapentin (NEURONTIN) 600 MG tablet Take 600 mg by mouth at bedtime.    Yes [provider]    Allergies:  Allergies  Allergen Reactions  . Keflex [Cephalexin] Rash    Past Medical History: Past Medical History:  Diagnosis Date  . Anemia   . Brown recluse spider bite   . Colon cancer ([place])   . Depression   . History of blood transfusion   . Neuropathy   . 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 CATARACT EXTRACTION     . PROSTATE SURGERY  2001    Social History: Patient lives with his wife.  Social History   Social History  . Marital status: Married    Spouse name: N/A  . Number of children: 2  . Years of education: N/A   Occupational History  . retired    Social History Main Topics  . Smoking status: Former Smoker    Packs/day: 1.00    Years: 20.00    Types: Cigarettes    Quit date: 05/07/1964  . Smokeless tobacco: Never Used  . Alcohol use 0.6 oz/week    1 Shots of liquor per week     Comment: he used to drink daily, not much lately   . Drug use: No  . Sexual activity: Not on file   Other Topics Concern  . Not on file   Social History Narrative   Married, wife Tommy Rivera - retired [employer] [place] here to [place] 2015-16--says he misses [place]   Was in the [employer] for 6 years   Has a daughter in [place] -His other daughter died from breast cancer      Family History:  Family History  Problem Relation Age of Onset  . Heart defect Mother   . Breast cancer Daughter   . Cancer Daughter 47       breast cancer  . Cancer Daughter 37       breast cancer   . Cancer Cousin  prostate cancer      Review of Systems - Difficult to do due to his hearing impairment  Physical Examination  Vitals:   10/22/16 1156 10/22/16 1410 10/22/16 1415 10/22/16 1444  BP: (!) 152/59 (!) 149/53 (!) 156/53   Pulse: 69 60 (!) 58 69  Resp: (!) 0 15 16 (!) 29  SpO2: 97% 98% 99% 100%  Weight:      Height:        BP (!) 156/53   Pulse 69   Resp (!) 29   Ht 5\' 6"  (1.676 m)   Wt 60.3 kg (133 lb)   SpO2 100%   BMI 21.47 kg/m   General appearance: alert, cooperative, appears stated age and no distress Head: Normocephalic, without obvious abnormality, atraumatic Eyes: conjunctivae/corneas clear. PERRL, EOM's intact.  Throat: lips, mucosa, and tongue normal; teeth and gums normal and Dry mucous membranes Neck: no adenopathy, no carotid bruit, no JVD, supple, symmetrical, trachea  midline and thyroid not enlarged, symmetric, no tenderness/mass/nodules Resp: Diminished air entry at the bases but mostly clear to auscultation Cardio: regular rate and rhythm, S1, S2 normal, no murmur, click, rub or gallop GI: Abdomen soft. Nondistended. There is a mass that is appreciated in the left lower quadrant. There is some tenderness in that area without any rebound, rigidity or guarding. Bowel sounds present. Extremities: edema bilateral lower extremities Skin: Skin color, texture, turgor normal. No rashes or lesions Neurologic: He is awake, alert. Hard of hearing. No focal neurological deficits.  Labs on Admission: I have personally reviewed following labs and imaging studies  CBC:  Recent Labs Lab 10/22/16 1156  WBC 15.0*  HGB 8.4*  HCT 26.6*  MCV 83.4  PLT 568*   Basic Metabolic Panel:  Recent Labs Lab 10/22/16 1156  NA 138  K 5.0  CL 104  CO2 28  GLUCOSE 96  BUN 18  CREATININE 1.50*  CALCIUM 8.2*   GFR: Estimated Creatinine Clearance: 26.2 mL/min (A) (by C-G formula based on SCr of 1.5 mg/dL (H)). Liver Function Tests:  Recent Labs Lab 10/22/16 1156  AST 12*  ALT 11*  ALKPHOS 74  BILITOT 0.5  PROT 6.0*  ALBUMIN 2.0*    Recent Labs Lab 10/22/16 1156  LIPASE 19    Radiological Exams on Admission: Dg Chest 2 View  Result Date: 10/22/2016 CLINICAL DATA:  One week of generalize weakness. Fell this morning. No current complaints. History of colonic malignancy. EXAM: CHEST  2 VIEW COMPARISON:  Chest x-ray of March 30, 2016 FINDINGS: The lungs are adequately inflated. There is no focal infiltrate. The interstitial markings are increased over baseline. The cardiac silhouette remains enlarged. The central pulmonary vascularity is mildly prominent. There is calcification in the wall of the thoracic aorta. IMPRESSION: Mild low-grade CHF superimposed upon chronic underlying bronchitic-smoking related changes. No acute pneumonia. Electronically Signed    By: David  Martinique M.D.   On: 10/22/2016 13:11   Ct Head Wo Contrast  Result Date: 10/22/2016 CLINICAL DATA:  Generalized weakness. EXAM: CT HEAD WITHOUT CONTRAST TECHNIQUE: Contiguous axial images were obtained from the base of the skull through the vertex without intravenous contrast. COMPARISON:  None. FINDINGS: Brain: Mild diffuse cortical atrophy is noted. Mild chronic ischemic white matter disease is noted. Old lacunar infarction is noted in right basal ganglia. No mass effect or midline shift is noted. Ventricular size is within normal limits. There is no evidence of mass lesion, hemorrhage or acute infarction. Vascular: Atherosclerosis carotid siphons is noted. Skull: Normal.  Negative for fracture or focal lesion. Sinuses/Orbits: No acute finding. Other: None. IMPRESSION: Mild diffuse cortical atrophy. Mild chronic ischemic white matter disease. No acute intracranial abnormality seen. Electronically Signed   By: Marijo Conception, M.D.   On: 10/22/2016 13:57   Ct Abdomen Pelvis W Contrast  Result Date: 10/22/2016 CLINICAL DATA:  Weakness EXAM: CT ABDOMEN AND PELVIS WITH CONTRAST TECHNIQUE: Multidetector CT imaging of the abdomen and pelvis was performed using the standard protocol following bolus administration of intravenous contrast. CONTRAST:  43mL ISOVUE-300 IOPAMIDOL (ISOVUE-300) INJECTION 61% COMPARISON:  01/05/2016 FINDINGS: Lower chest: Small bilateral pleural effusions are noted right greater than left. Dependent atelectatic changes are seen. No focal infiltrate or sizable effusion is noted. Coronary calcifications are noted. Hepatobiliary: No focal liver abnormality is seen. No gallstones, gallbladder wall thickening, or biliary dilatation. Pancreas: Unremarkable. No pancreatic ductal dilatation or surrounding inflammatory changes. Spleen: Normal in size without focal abnormality. Adrenals/Urinary Tract: The adrenal glands are within normal limits. The left kidney demonstrates a few small  nonobstructing calculi. The largest of these lies in the lower pole measuring approximately 6 mm. An exophytic cyst is also seen. These changes are stable from the prior exam. The right kidney demonstrates significant obstructive change with irregular enhancement of the collecting system and proximal ureter suggesting a superimposed infection. The dilated right ureter extends inferiorly and has some nodular appearing enhancement identified best seen on image number 74 of series 2. Adjacent to this area is the known locally invasive rectosigmoid carcinoma. There is also apparent extension into the superior wall of the urinary bladder on the right. Bladder is partially distended. The invasive component measures at least 2 by 2.5 cm although actual measurements are difficult. Stomach/Bowel: The known locally in the bases of the rectosigmoid carcinoma is again identified. The wall thickening has increased and the distribution of the tumor in the colon has increased. There is now significant extension into the bladder wall with a focal filling defect within the bladder wall. There is also been apparent extension into the distal right ureter increase in the degree of obstruction. The appendix is well visualized and within normal limits. There is a moderate fecal burden in the proximal colon consistent with constipation related to the underlying narrowing in the rectosigmoid. Vascular/Lymphatic: Aortic atherosclerosis. No enlarged abdominal or pelvic lymph nodes. Reproductive: .Prostate is well visualize with central defect consistent with prior surgery. Other: No abdominal wall hernia or abnormality. No abdominopelvic ascites. Musculoskeletal: Degenerative changes of the lumbar spine are noted. 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 Associated constipation is  noted as well. Significant increase in the degree of obstruction in the right kidney. Enhancement of the walls of the collecting system and proximal ureters suggests superimposed infection. Bilateral small pleural effusions. Nonobstructing left renal stones. Electronically Signed   By: Inez Catalina M.D.   On: 10/22/2016 14:03    My interpretation of Electrocardiogram: Sinus rhythm at 71 bpm. Normal axis. right bundle branch block is noted. No concerning ST or T-wave changes   Problem List  Principal Problem:   UTI (urinary tract infection) Active Problems:   Cancer of left colon (HCC)   Hydronephrosis of right kidney   Metastatic colorectal cancer (HCC)   Normocytic anemia   Assessment: This is a 83 year old Caucasian male with past negative history of rectal cancer, which has progressed. It's now invading the urinary bladder  and the right ureter, resulting in a right-handed hydronephrosis. He has an abnormal UA suggesting urinary tract infection. He's lost weight. He's had multiple mechanical falls. Family has been finding it difficult to take care of him in his current state.  Plan: #1 Urinary tract infection in the setting of metastatic colon cancer invading the urinary bladder as well as right ureter. He will be treated with ciprofloxacin for now. IV fluids.  #2 Progressive and metastatic rectal/colon cancer. Cancer has progressed. Patient has lost significant amount of weight over the last couple of months. He's becoming more more fatigued. CT scan as above. Discussed with Dr. Carlean Purl with gastroenterology. He agrees that the patient is declining and agrees with the plan for palliative medicine/hospice. We will consult palliative medicine to assist with the disposition and with management.  #3 Right-sided hydronephrosis due to progressive cancer: Patient not a candidate for any aggressive interventions.  #4 Normocytic anemia: Patient has had the rectal bleeding, so there is an element  of chronic blood loss as well. Hemoglobin was 11.5 in April. So, it is significantly lower today. Will not be transfused at this point in time. We will wait for palliative medicine input.  #5 Mild dehydration/failure to thrive/frequent falls: Chest x-ray suggested fluid overload, but clinically he appears to be dehydrated. Gentle IV fluids. PT and OT.  DVT Prophylaxis: SCDs Code Status: Discussed with the patient. Patient wife and daughter. DO NOT RESUSCITATE Family Communication: Discussed with the patient's wife and daughter  Consults called: Phone discussion with Dr. Carlean Purl with gastroenterology. Palliative medicine consulted.   Severity of Illness: The appropriate patient status for this patient is INPATIENT. Inpatient status is judged to be reasonable and necessary in order to provide the required intensity of service to ensure the patient's safety. The patient's presenting symptoms, physical exam findings, and initial radiographic and laboratory data in the context of their chronic comorbidities is felt to place them at high risk for further clinical deterioration. Furthermore, it is not anticipated that the patient will be medically stable for discharge from the hospital within 2 midnights of admission. The following factors support the patient status of inpatient.   " The patient's presenting symptoms include falls, fatigue, weight loss. " The worrisome physical exam findings include left-sided abdominal mass. " The initial radiographic and laboratory data are worrisome because of anemia, UTI. " The chronic co-morbidities include rectal cancer.   * I certify that at the point of admission it is my clinical judgment that the patient will require inpatient hospital care spanning beyond 2 midnights from the point of admission due to high intensity of service, high risk for further deterioration and high frequency of surveillance required.*  Further management decisions will depend on  results of further testing and patient's response to treatment.   [facility]  [facility] Pager [phone]  If 7PM-7AM, please contact night-coverage [email or web address] Password [facility]  10/22/2016, 3:29 PM

## 2016-10-22 NOTE — ED Triage Notes (Signed)
Brought in by ems for for generalized weakness x 1 week and fall this am with no complaints

## 2016-10-22 NOTE — ED Provider Notes (Signed)
[facility] DEPT Provider Note   CSN: [id number] Arrival date & time: 10/22/16  1142     History   Chief Complaint Chief Complaint  Patient presents with  . Fall    HPI Tommy Rivera is a 83 y.o. male.  HPI Patient presents to the emergency department with increasing generalized weakness over the past 2 months.  Family reports that his symptoms are worsened over the past week and his had multiple falls.  Ultimately he presents the ER today after a fall the family was unable to pick him up.  No fevers or chills.  He has colon cancer for which she has not elected treatment or surgery.  He does not want a colostomy.  Family is concerned about the possibility of progression of his cancer.  No altered mental status.  No chest pain or shortness of breath.  Patient without significant complaints at this time.  Family states that he can barely get out of bed now.  He is still able to walk somewhat with a walker but he is becoming more weak.  He fell today while on the commode   Past Medical History:  Diagnosis Date  . Anemia   . Brown recluse spider bite   . Colon cancer ([place])   . Depression   . History of blood transfusion   . Neuropathy   . 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 Hydronephrosis of right kidney 01/02/2016  . Anemia, iron deficiency 12/15/2015  . CKD (chronic kidney disease), stage III 12/15/2015  . Cancer of left colon ([place]) 09/15/2015    Past Surgical History:  Procedure Laterality Date  . CATARACT EXTRACTION    . PROSTATE SURGERY  2001       Home Medications    Prior to Admission medications   Medication Sig Start Date End Date Taking? Authorizing Provider  gabapentin (NEURONTIN) 600 MG tablet Take 600 mg by mouth at bedtime.    Yes [provider]    Family History Family History  Problem Relation Age of Onset  . Heart defect  Mother   . Breast cancer Daughter   . Cancer Daughter 45       breast cancer  . Cancer Daughter 74       breast cancer   . Cancer Cousin        prostate cancer     Social History Social History  Substance Use Topics  . Smoking status: Former Smoker    Packs/day: 1.00    Years: 20.00    Types: Cigarettes    Quit date: 05/07/1964  . Smokeless tobacco: Never Used  . Alcohol use 0.6 oz/week    1 Shots of liquor per week     Comment: he used to drink daily, not much lately      Allergies   Keflex [cephalexin]   Review of Systems Review of Systems  All other systems reviewed and are negative.    Physical Exam Updated Vital Signs BP (!) 156/53   Pulse 69   Resp (!) 29   Ht 5\' 6"  (1.676 m)   Wt 60.3 kg (133 lb)   SpO2 100%   BMI 21.47 kg/m   Physical Exam  Constitutional:  Frail  HENT:  Head: Normocephalic and atraumatic.  Eyes: EOM are normal.  Neck: Normal range of motion.  Cardiovascular: Normal rate, regular rhythm and intact  distal pulses.   Pulmonary/Chest: Effort normal and breath sounds normal. No respiratory distress.  Abdominal: He exhibits no distension. There is no tenderness.  Musculoskeletal: Normal range of motion.  Full range of motion bilateral hips, knees, ankles.  Full range motion bilateral shoulders, elbows, hands.  Normal strength bilaterally in both upper and lower extremity major muscle groups.  Neurological: He is alert.  Skin: Skin is warm and dry.  Nursing note and vitals reviewed.    ED Treatments / Results  Labs (all labs ordered are listed, but only abnormal results are displayed) Labs Reviewed  URINALYSIS, ROUTINE W REFLEX MICROSCOPIC - Abnormal; Notable for the following:       Result Value   APPearance CLOUDY (*)    Hgb urine dipstick SMALL (*)    Protein, ur 100 (*)    Nitrite POSITIVE (*)    Leukocytes, UA LARGE (*)    Bacteria, UA RARE (*)    Squamous Epithelial / LPF 6-30 (*)    Non Squamous Epithelial 0-5 (*)     All other components within normal limits  CBC - Abnormal; Notable for the following:    WBC 15.0 (*)    RBC 3.19 (*)    Hemoglobin 8.4 (*)    HCT 26.6 (*)    RDW 15.9 (*)    Platelets 419 (*)    All other components within normal limits  COMPREHENSIVE METABOLIC PANEL - Abnormal; Notable for the following:    Creatinine, Ser 1.50 (*)    Calcium 8.2 (*)    Total Protein 6.0 (*)    Albumin 2.0 (*)    AST 12 (*)    ALT 11 (*)    GFR calc non Af Amer 38 (*)    GFR calc Af Amer 44 (*)    All other components within normal limits  URINE CULTURE  LIPASE, BLOOD  I-STAT TROPOININ, ED    EKG  EKG Interpretation  Date/Time:  Monday October 22 2016 12:00:52 EDT Ventricular Rate:  71 PR Interval:    QRS Duration: 158 QT Interval:  429 QTC Calculation: 460 R Axis:   -14 Text Interpretation:  Sinus or ectopic atrial rhythm Atrial premature complex Right bundle branch block Baseline wander in lead(s) V4 No old tracing to compare Confirmed by Jola Schmidt [phone]) on 10/22/2016 3:06:09 PM       Radiology Dg Chest 2 View  Result Date: 10/22/2016 CLINICAL DATA:  One week of generalize weakness. Fell this morning. No current complaints. History of colonic malignancy. EXAM: CHEST  2 VIEW COMPARISON:  Chest x-ray of March 30, 2016 FINDINGS: The lungs are adequately inflated. There is no focal infiltrate. The interstitial markings are increased over baseline. The cardiac silhouette remains enlarged. The central pulmonary vascularity is mildly prominent. There is calcification in the wall of the thoracic aorta. IMPRESSION: Mild low-grade CHF superimposed upon chronic underlying bronchitic-smoking related changes. No acute pneumonia. Electronically Signed   By: David  Martinique M.D.   On: 10/22/2016 13:11   Ct Head Wo Contrast  Result Date: 10/22/2016 CLINICAL DATA:  Generalized weakness. EXAM: CT HEAD WITHOUT CONTRAST TECHNIQUE: Contiguous axial images were obtained from the base of the skull  through the vertex without intravenous contrast. COMPARISON:  None. FINDINGS: Brain: Mild diffuse cortical atrophy is noted. Mild chronic ischemic white matter disease is noted. Old lacunar infarction is noted in right basal ganglia. No mass effect or midline shift is noted. Ventricular size is within normal limits. There is no evidence of  mass lesion, hemorrhage or acute infarction. Vascular: Atherosclerosis carotid siphons is noted. Skull: Normal. Negative for fracture or focal lesion. Sinuses/Orbits: No acute finding. Other: None. IMPRESSION: Mild diffuse cortical atrophy. Mild chronic ischemic white matter disease. No acute intracranial abnormality seen. Electronically Signed   By: Marijo Conception, M.D.   On: 10/22/2016 13:57   Ct Abdomen Pelvis W Contrast  Result Date: 10/22/2016 CLINICAL DATA:  Weakness EXAM: CT ABDOMEN AND PELVIS WITH CONTRAST TECHNIQUE: Multidetector CT imaging of the abdomen and pelvis was performed using the standard protocol following bolus administration of intravenous contrast. CONTRAST:  36mL ISOVUE-300 IOPAMIDOL (ISOVUE-300) INJECTION 61% COMPARISON:  01/05/2016 FINDINGS: Lower chest: Small bilateral pleural effusions are noted right greater than left. Dependent atelectatic changes are seen. No focal infiltrate or sizable effusion is noted. Coronary calcifications are noted. Hepatobiliary: No focal liver abnormality is seen. No gallstones, gallbladder wall thickening, or biliary dilatation. Pancreas: Unremarkable. No pancreatic ductal dilatation or surrounding inflammatory changes. Spleen: Normal in size without focal abnormality. Adrenals/Urinary Tract: The adrenal glands are within normal limits. The left kidney demonstrates a few small nonobstructing calculi. The largest of these lies in the lower pole measuring approximately 6 mm. An exophytic cyst is also seen. These changes are stable from the prior exam. The right kidney demonstrates significant obstructive change with  irregular enhancement of the collecting system and proximal ureter suggesting a superimposed infection. The dilated right ureter extends inferiorly and has some nodular appearing enhancement identified best seen on image number 74 of series 2. Adjacent to this area is the known locally invasive rectosigmoid carcinoma. There is also apparent extension into the superior wall of the urinary bladder on the right. Bladder is partially distended. The invasive component measures at least 2 by 2.5 cm although actual measurements are difficult. Stomach/Bowel: The known locally in the bases of the rectosigmoid carcinoma is again identified. The wall thickening has increased and the distribution of the tumor in the colon has increased. There is now significant extension into the bladder wall with a focal filling defect within the bladder wall. There is also been apparent extension into the distal right ureter increase in the degree of obstruction. The appendix is well visualized and within normal limits. There is a moderate fecal burden in the proximal colon consistent with constipation related to the underlying narrowing in the rectosigmoid. Vascular/Lymphatic: Aortic atherosclerosis. No enlarged abdominal or pelvic lymph nodes. Reproductive: .Prostate is well visualize with central defect consistent with prior surgery. Other: No abdominal wall hernia or abnormality. No abdominopelvic ascites. Musculoskeletal: Degenerative changes of the lumbar spine are noted. 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 Associated constipation is noted as well. Significant increase in the degree of obstruction in the right kidney. Enhancement of the walls of the collecting system and proximal ureters suggests superimposed infection. Bilateral small pleural effusions. Nonobstructing left  renal stones. Electronically Signed   By: Inez Catalina M.D.   On: 10/22/2016 14:03    Procedures Procedures (including critical care time)  Medications Ordered in ED Medications  ciprofloxacin (CIPRO) IVPB 400 mg (400 mg Intravenous New Bag/Given 10/22/16 1427)  iopamidol (ISOVUE-300) 61 % injection 100 mL (60 mLs Intravenous Contrast Given 10/22/16 1321)     Initial Impression / Assessment and Plan / ED Course  I have reviewed the triage vital signs and the nursing notes.  Pertinent labs & imaging results that  were available during my care of the patient were reviewed by me and considered in my medical decision making (see chart for details).     Patient with progression of his colon cancer noted on CT.  Now invading the bladder and obstructing the right ureter.  He is our he elected to have no treatment for his colon cancer and elected no colostomy.  I do not think these can be a great candidate for nephrostomy or ureteral stent.  I long discussion with the patient the patient's family.  Positive care consult will be necessary.  IV antibiotics now.  Keflex allergy.  Patient given IV Cipro.  Urine culture sent.  Hospitalist admission  Final Clinical Impressions(s) / ED Diagnoses   Final diagnoses:  Malignant neoplasm of colon, unspecified part of colon ([place])  Acute cystitis without hematuria  Ureteral obstruction    New Prescriptions New Prescriptions   No medications on file     Jola Schmidt, MD 10/22/16 [phone]

## 2016-10-23 DIAGNOSIS — C189 Malignant neoplasm of colon, unspecified: Secondary | ICD-10-CM

## 2016-10-23 DIAGNOSIS — N3000 Acute cystitis without hematuria: Secondary | ICD-10-CM

## 2016-10-23 DIAGNOSIS — R5381 Other malaise: Secondary | ICD-10-CM

## 2016-10-23 DIAGNOSIS — N183 Chronic kidney disease, stage 3 (moderate): Secondary | ICD-10-CM

## 2016-10-23 DIAGNOSIS — Z515 Encounter for palliative care: Secondary | ICD-10-CM

## 2016-10-23 DIAGNOSIS — N135 Crossing vessel and stricture of ureter without hydronephrosis: Secondary | ICD-10-CM

## 2016-10-23 DIAGNOSIS — R296 Repeated falls: Secondary | ICD-10-CM

## 2016-10-23 DIAGNOSIS — Z7189 Other specified counseling: Secondary | ICD-10-CM

## 2016-10-23 DIAGNOSIS — R634 Abnormal weight loss: Secondary | ICD-10-CM

## 2016-10-23 LAB — HEMOGLOBIN AND HEMATOCRIT, BLOOD
HCT: 27.9 % — ABNORMAL LOW (ref 39.0–52.0)
HEMOGLOBIN: 8.9 g/dL — AB (ref 13.0–17.0)

## 2016-10-23 LAB — BASIC METABOLIC PANEL
Anion gap: 4 — ABNORMAL LOW (ref 5–15)
BUN: 18 mg/dL (ref 6–20)
CALCIUM: 7.3 mg/dL — AB (ref 8.9–10.3)
CO2: 25 mmol/L (ref 22–32)
Chloride: 106 mmol/L (ref 101–111)
Creatinine, Ser: 1.42 mg/dL — ABNORMAL HIGH (ref 0.61–1.24)
GFR, EST AFRICAN AMERICAN: 48 mL/min — AB (ref >60)
GFR, EST NON AFRICAN AMERICAN: 41 mL/min — AB (ref >60)
GLUCOSE: 91 mg/dL (ref 65–99)
POTASSIUM: 4.2 mmol/L (ref 3.5–5.1)
Sodium: 135 mmol/L (ref 135–145)

## 2016-10-23 LAB — CBC
HEMATOCRIT: 24 % — AB (ref 39.0–52.0)
HEMOGLOBIN: 7.6 g/dL — AB (ref 13.0–17.0)
MCH: 26.8 pg (ref 26.0–34.0)
MCHC: 31.7 g/dL (ref 30.0–36.0)
MCV: 84.5 fL (ref 78.0–100.0)
Platelets: 368 10*3/uL (ref 150–400)
RBC: 2.84 MIL/uL — AB (ref 4.22–5.81)
RDW: 16.2 % — ABNORMAL HIGH (ref 11.5–15.5)
WBC: 14.5 10*3/uL — ABNORMAL HIGH (ref 4.0–10.5)

## 2016-10-23 LAB — ABO/RH: ABO/RH(D): O NEG

## 2016-10-23 LAB — PREPARE RBC (CROSSMATCH)

## 2016-10-23 MED ORDER — SODIUM CHLORIDE 0.9 % IV SOLN
Freq: Once | INTRAVENOUS | Status: AC
  Administered 2016-10-23: 16:00:00 via INTRAVENOUS

## 2016-10-23 NOTE — Progress Notes (Signed)
OT Cancellation Note  Patient Details Name: Tommy Rivera MRN: [id number] DOB: December 30, 1921   Cancelled Treatment:    Reason Eval/Treat Not Completed: Other (comment).  Pt is back to bed and sleeping. Family would like to be present when I complete OT eval.  Palliative meeting is scheduled for 100.  I will either return this pm or tomorrow as schedule permits.    Tarius Stangelo 10/23/2016, 12:44 PM  Lesle Chris, OTR/L [phone] 10/23/2016

## 2016-10-23 NOTE — Progress Notes (Signed)
PROGRESS NOTE    Tommy Rivera   HGD:[id number]  DOB: [birth year]  DOA: 10/22/2016 PCP: Patient, No Pcp Per   Brief Narrative:  83 y/o male with colon cancer who has seen Dr Burr Medico, GI, urology and general surgery in the past. The rectosigmoid tumor was discovered on 09/15/15 and surgery was offered but was declined by patient and family as they felt it was too high a risk. The mass was obstructing the right ureter and it was decided not to place a ureter stent. Palliative chemo was not recommended due to his limited performance status. Palliative radiation was discussed in case ostruction or bleeding occurred.   Subjective: Feels weak but has no other complaints today.   Assessment & Plan:   Principal Problem:   UTI (urinary tract infection) with rectal cancer causing hydronephrosis of R Ureter and invading the bladder    Leukocytosis    Fever of 100.2 - Keflex causes rash- currently receiving Cipro which I will continue- awaiting culture  Active Problems:   Cancer of left colon - progressing  - with weight loss, frequent falls- lives at home with his wife - not a candidate for treatments - Palliative care consulted    CKD (chronic kidney disease), stage III - stable    Normocytic anemia - likely contributing to falls - Palliative care to address [place] - will hold off on transfusion for now   DVT prophylaxis: SCDs Code Status: DNR Family Communication: wife at bedside Disposition Plan:  Consultants:    Procedures:    Antimicrobials:  Anti-infectives    Start     Dose/Rate Route Frequency Ordered Stop   10/23/16 1400  ciprofloxacin (CIPRO) IVPB 400 mg     400 mg 200 mL/hr over 60 Minutes Intravenous Every 24 hours 10/22/16 1646     10/22/16 1400  ciprofloxacin (CIPRO) IVPB 400 mg     400 mg 200 mL/hr over 60 Minutes Intravenous  Once 10/22/16 1353 10/22/16 1527       Objective: Vitals:   10/22/16 1444 10/22/16 1640 10/22/16 2145 10/23/16 0614  BP:  103/84 (!)  127/49 (!) 145/48  Pulse: 69 60 72 75  Resp: (!) 29 18 (!) 22 20  Temp:  98 F (36.7 C) 97.8 F (36.6 C) 100.2 F (37.9 C)  TempSrc:  Oral Oral Oral  SpO2: 100% 90% 96% 96%  Weight:  63.5 kg (139 lb 15.9 oz)    Height:  5\' 7"  (1.702 m)      Intake/Output Summary (Last 24 hours) at 10/23/16 1347 Last data filed at 10/23/16 1030  Gross per 24 hour  Intake             1.25 ml  Output              111 ml  Net          -109.75 ml   Filed Weights   10/22/16 1149 10/22/16 1640  Weight: 60.3 kg (133 lb) 63.5 kg (139 lb 15.9 oz)    Examination: General exam: Appears comfortable  HEENT: PERRLA, oral mucosa moist, no sclera icterus or thrush Respiratory system: Clear to auscultation. Respiratory effort normal. Cardiovascular system: S1 & S2 heard, RRR.  No murmurs  Gastrointestinal system: Abdomen soft, non-tender, nondistended. Normal bowel sound. No organomegaly Central nervous system: Alert and oriented. No focal neurological deficits. Extremities: No cyanosis, clubbing or edema Skin: No rashes or ulcers Psychiatry:  Mood & affect appropriate.     Data Reviewed: I have personally  reviewed following labs and imaging studies  CBC:  Recent Labs Lab 10/22/16 1156 10/23/16 0431  WBC 15.0* 14.5*  HGB 8.4* 7.6*  HCT 26.6* 24.0*  MCV 83.4 84.5  PLT 419* 945   Basic Metabolic Panel:  Recent Labs Lab 10/22/16 1156 10/23/16 0431  NA 138 135  K 5.0 4.2  CL 104 106  CO2 28 25  GLUCOSE 96 91  BUN 18 18  CREATININE 1.50* 1.42*  CALCIUM 8.2* 7.3*   GFR: Estimated Creatinine Clearance: 29.2 mL/min (A) (by C-G formula based on SCr of 1.42 mg/dL (H)). Liver Function Tests:  Recent Labs Lab 10/22/16 1156  AST 12*  ALT 11*  ALKPHOS 74  BILITOT 0.5  PROT 6.0*  ALBUMIN 2.0*    Recent Labs Lab 10/22/16 1156  LIPASE 19   No results for input(s): AMMONIA in the last 168 hours. Coagulation Profile: No results for input(s): INR, PROTIME in the last 168  hours. Cardiac Enzymes: No results for input(s): CKTOTAL, CKMB, CKMBINDEX, TROPONINI in the last 168 hours. BNP (last 3 results) No results for input(s): PROBNP in the last 8760 hours. HbA1C: No results for input(s): HGBA1C in the last 72 hours. CBG: No results for input(s): GLUCAP in the last 168 hours. Lipid Profile: No results for input(s): CHOL, HDL, LDLCALC, TRIG, CHOLHDL, LDLDIRECT in the last 72 hours. Thyroid Function Tests: No results for input(s): TSH, T4TOTAL, FREET4, T3FREE, THYROIDAB in the last 72 hours. Anemia Panel: No results for input(s): VITAMINB12, FOLATE, FERRITIN, TIBC, IRON, RETICCTPCT in the last 72 hours. Urine analysis:    Component Value Date/Time   COLORURINE YELLOW 10/22/2016 1229   APPEARANCEUR CLOUDY (A) 10/22/2016 1229   LABSPEC 1.013 10/22/2016 1229   PHURINE 6.0 10/22/2016 1229   GLUCOSEU NEGATIVE 10/22/2016 1229   HGBUR SMALL (A) 10/22/2016 1229   BILIRUBINUR NEGATIVE 10/22/2016 1229   KETONESUR NEGATIVE 10/22/2016 1229   PROTEINUR 100 (A) 10/22/2016 1229   NITRITE POSITIVE (A) 10/22/2016 1229   LEUKOCYTESUR LARGE (A) 10/22/2016 1229   Sepsis Labs: @LABRCNTIP (procalcitonin:4,lacticidven:4) )No results found for this or any previous visit (from the past 240 hour(s)).       Radiology Studies: Dg Chest 2 View  Result Date: 10/22/2016 CLINICAL DATA:  One week of generalize weakness. Fell this morning. No current complaints. History of colonic malignancy. EXAM: CHEST  2 VIEW COMPARISON:  Chest x-ray of March 30, 2016 FINDINGS: The lungs are adequately inflated. There is no focal infiltrate. The interstitial markings are increased over baseline. The cardiac silhouette remains enlarged. The central pulmonary vascularity is mildly prominent. There is calcification in the wall of the thoracic aorta. IMPRESSION: Mild low-grade CHF superimposed upon chronic underlying bronchitic-smoking related changes. No acute pneumonia. Electronically Signed    By: David  Martinique M.D.   On: 10/22/2016 13:11   Ct Head Wo Contrast  Result Date: 10/22/2016 CLINICAL DATA:  Generalized weakness. EXAM: CT HEAD WITHOUT CONTRAST TECHNIQUE: Contiguous axial images were obtained from the base of the skull through the vertex without intravenous contrast. COMPARISON:  None. FINDINGS: Brain: Mild diffuse cortical atrophy is noted. Mild chronic ischemic white matter disease is noted. Old lacunar infarction is noted in right basal ganglia. No mass effect or midline shift is noted. Ventricular size is within normal limits. There is no evidence of mass lesion, hemorrhage or acute infarction. Vascular: Atherosclerosis carotid siphons is noted. Skull: Normal. Negative for fracture or focal lesion. Sinuses/Orbits: No acute finding. Other: None. IMPRESSION: Mild diffuse cortical atrophy. Mild chronic ischemic white  matter disease. No acute intracranial abnormality seen. Electronically Signed   By: Marijo Conception, M.D.   On: 10/22/2016 13:57   Ct Abdomen Pelvis W Contrast  Result Date: 10/22/2016 CLINICAL DATA:  Weakness EXAM: CT ABDOMEN AND PELVIS WITH CONTRAST TECHNIQUE: Multidetector CT imaging of the abdomen and pelvis was performed using the standard protocol following bolus administration of intravenous contrast. CONTRAST:  85mL ISOVUE-300 IOPAMIDOL (ISOVUE-300) INJECTION 61% COMPARISON:  01/05/2016 FINDINGS: Lower chest: Small bilateral pleural effusions are noted right greater than left. Dependent atelectatic changes are seen. No focal infiltrate or sizable effusion is noted. Coronary calcifications are noted. Hepatobiliary: No focal liver abnormality is seen. No gallstones, gallbladder wall thickening, or biliary dilatation. Pancreas: Unremarkable. No pancreatic ductal dilatation or surrounding inflammatory changes. Spleen: Normal in size without focal abnormality. Adrenals/Urinary Tract: The adrenal glands are within normal limits. The left kidney demonstrates a few small  nonobstructing calculi. The largest of these lies in the lower pole measuring approximately 6 mm. An exophytic cyst is also seen. These changes are stable from the prior exam. The right kidney demonstrates significant obstructive change with irregular enhancement of the collecting system and proximal ureter suggesting a superimposed infection. The dilated right ureter extends inferiorly and has some nodular appearing enhancement identified best seen on image number 74 of series 2. Adjacent to this area is the known locally invasive rectosigmoid carcinoma. There is also apparent extension into the superior wall of the urinary bladder on the right. Bladder is partially distended. The invasive component measures at least 2 by 2.5 cm although actual measurements are difficult. Stomach/Bowel: The known locally in the bases of the rectosigmoid carcinoma is again identified. The wall thickening has increased and the distribution of the tumor in the colon has increased. There is now significant extension into the bladder wall with a focal filling defect within the bladder wall. There is also been apparent extension into the distal right ureter increase in the degree of obstruction. The appendix is well visualized and within normal limits. There is a moderate fecal burden in the proximal colon consistent with constipation related to the underlying narrowing in the rectosigmoid. Vascular/Lymphatic: Aortic atherosclerosis. No enlarged abdominal or pelvic lymph nodes. Reproductive: .Prostate is well visualize with central defect consistent with prior surgery. Other: No abdominal wall hernia or abnormality. No abdominopelvic ascites. Musculoskeletal: Degenerative changes of the lumbar spine are noted. 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 Associated constipation is  noted as well. Significant increase in the degree of obstruction in the right kidney. Enhancement of the walls of the collecting system and proximal ureters suggests superimposed infection. Bilateral small pleural effusions. Nonobstructing left renal stones. Electronically Signed   By: Inez Catalina M.D.   On: 10/22/2016 14:03      Scheduled Meds: . gabapentin  300 mg Oral QHS   Continuous Infusions: . ciprofloxacin       LOS: 1 day    Time spent in minutes: [place], MD [facility] Pager: [email or web address] Password [email or web address] 10/23/2016, 1:47 PM

## 2016-10-23 NOTE — Evaluation (Signed)
Physical Therapy Evaluation Patient Details Name: Tommy Rivera MRN: [id number] DOB: 1922-03-23 Today's Date: 10/23/2016   History of Present Illness  Tommy Rivera is a 83 y.o. male with a past medical history of colon cancer, predominantly in the rectal area for which the patient elected no treatment/surgery. He otherwise does not have any significant health problems. He was brought in today because patient has been falling more frequently at home  Clinical Impression  Pt admitted with above diagnosis. Pt currently with functional limitations due to the deficits listed below (see PT Problem List). * Pt will benefit from skilled PT to increase their independence and safety with mobility to allow discharge to the venue listed below.   Pt/family very pleasant and cooperative, family emotional regarding CA prognosis; Palliative care mtg scheduled  later today; pt is very motivated to get OOB and states "it feels SO good to be up!"; he is quite weak, deconditioned and with decr balance putting him at risk for continued falls;  Recommend DME noted below if pt D/C's home; will continue to follow in acute setting and assess further for needs after Palliative meeting     Follow Up Recommendations No PT follow up    Equipment Recommendations  3in1 (PT);Hospital bed;Wheelchair cushion (measurements PT)    Recommendations for Other Services       Precautions / Restrictions Precautions Precautions: Fall      Mobility  Bed Mobility Overal bed mobility: Needs Assistance Bed Mobility: Supine to Sit     Supine to sit: Mod assist     General bed mobility comments: assist to elevate trunk from flat bed, heavy use of rails; pt is able to scoot once in sitting position with min-guard  Transfers Overall transfer level: Needs assistance Equipment used: Rolling walker (2 wheeled) Transfers: Sit to/from [facility] Sit to Stand: Min assist Stand pivot transfers:  (stand pivot bed to  [facility] and pivotal steps BSC to chair)       General transfer comment: assist to rise and for stability in standing; multi-modal cues for UE placement and overall safety  Ambulation/Gait Ambulation/Gait assistance: Min assist Ambulation Distance (Feet): 5 Feet Assistive device: Rolling walker (2 wheeled) Gait Pattern/deviations: Shuffle;Trunk flexed;Narrow base of support     General Gait Details: cues for posture, RW position and overall safety, requires min assist throughout for balance  Stairs            Wheelchair Mobility    Modified Rankin (Stroke Patients Only)       Balance Overall balance assessment: Needs assistance;History of Falls Sitting-balance support: No upper extremity supported;Feet supported Sitting balance-Leahy Scale: Fair       Standing balance-Leahy Scale: Poor Standing balance comment: requires bil UE support and external assist to maintain static standing safely/ has had ~7 falls in last 3 wks                              Pertinent Vitals/Pain Pain Assessment: No/denies pain    Home Living Family/patient expects to be discharged to:: Unsure Living Arrangements: Spouse/significant other                    Prior Function Level of Independence: Needs assistance   Gait / Transfers Assistance Needed: amb with RW in house recently, not using AD in the house prior to that, used "walking stick" outside     Comments: has had 7 falls in ~3 wks  Hand Dominance        Extremity/Trunk Assessment   Upper Extremity Assessment Upper Extremity Assessment: Generalized weakness    Lower Extremity Assessment Lower Extremity Assessment: Generalized weakness;RLE deficits/detail RLE Deficits / Details: limited knee extension bil LEs when hips flexed  (baseline for pt), gerneral atrophy bil        Communication   Communication: HOH  Cognition Arousal/Alertness: Awake/alert Behavior During Therapy: WFL for tasks  assessed/performed Overall Cognitive Status: Within Functional Limits for tasks assessed                                        General Comments      Exercises     Assessment/Plan    PT Assessment Patient needs continued PT services  PT Problem List Decreased strength;Decreased balance;Decreased activity tolerance;Decreased mobility       PT Treatment Interventions DME instruction;Gait training;Functional mobility training;Therapeutic activities;Therapeutic exercise;Patient/family education    PT Goals (Current goals can be found in the Care Plan section)  Acute Rehab PT Goals Patient Stated Goal: pt would like to be able to get up OOB  PT Goal Formulation: With patient Time For Goal Achievement: 10/30/16 Potential to Achieve Goals: Poor    Frequency Min 3X/week   Barriers to discharge        Co-evaluation               AM-PAC PT "6 Clicks" Daily Activity  Outcome Measure Difficulty turning over in bed (including adjusting bedclothes, sheets and blankets)?: Total Difficulty moving from lying on back to sitting on the side of the bed? : Total Difficulty sitting down on and standing up from a chair with arms (e.g., wheelchair, bedside commode, etc,.)?: Total Help needed moving to and from a bed to chair (including a wheelchair)?: A Little Help needed walking in hospital room?: A Little Help needed climbing 3-5 steps with a railing? : A Lot 6 Click Score: 11    End of Session Equipment Utilized During Treatment: Gait belt Activity Tolerance: Patient tolerated treatment well Patient left: in chair;with call bell/phone within reach;with family/visitor present;with chair alarm set Nurse Communication: Mobility status PT Visit Diagnosis: Muscle weakness (generalized) (M62.81);Repeated falls (R29.6)    Time: 1018-1100 PT Time Calculation (min) (ACUTE ONLY): 42 min   Charges:   PT Evaluation $PT Eval Moderate Complexity: 1 Procedure PT  Treatments $Therapeutic Activity: 23-37 mins   PT G CodesKenyon Ana, PT Pager: [phone] 10/23/2016   [facility] 10/23/2016, 11:12 AM

## 2016-10-23 NOTE — Progress Notes (Signed)
OT Cancellation Note  Patient Details Name: [name] MRN: [id number] DOB: 1921/12/05   Cancelled Treatment:    Reason Eval/Treat Not Completed: Other (comment). Family is not in room. I will return tomorrow.  Gumaro Brightbill 10/23/2016, 2:56 PM  Lesle Chris, OTR/L [phone] 10/23/2016

## 2016-10-23 NOTE — Clinical Social Work Note (Signed)
Clinical Social Work Assessment  Patient Details  Name: Tommy Rivera MRN: [id number] Date of Birth: 06-Sep-1921  Date of referral:  10/23/16               Reason for consult:  Facility Placement, Discharge Planning                Permission sought to share information with:  Family Supports Permission granted to share information::  Yes, Verbal Permission Granted  Name::     daughter Olin Hauser [phone], wife Tessie Fass [phone]  Agency::     Relationship::     Contact Information:     Housing/Transportation Living arrangements for the past 2 months:  Single Family Home Source of Information:  Palliative Care Team, Medical Team, Adult Children, Spouse Patient Interpreter Needed:  None Criminal Activity/Legal Involvement Pertinent to Current Situation/Hospitalization:  No - Comment as needed Significant Relationships:  Adult Children, Spouse Lives with:  Spouse Do you feel safe going back to the place where you live?  Yes Need for family participation in patient care:  Yes (Comment) (family involved in care decisions)  Care giving concerns:  Pt from home where he resides with his wife. Per family, there is understanding that pt is no longer doing well at home and has declined in ability to ambulate. They explain that pt's health in general has declined and that they are pursuing palliative care. Explain that long-term plan is for pt to return home with family, however they are hoping to have pt participate in [place] rehab in order to progress with mobility to bring him to a level that they could manage better at home.    Social Worker assessment / plan:  CSW consulted for potential SNF placement.  Met with pt at bedside- pt sleeping but discussed with daughter and wife.  Reviewed evaluation of pt's therapy needs. Explained to family the need for pt to be able to participate in therapy and be considered candidate for rehabilitation. Family understanding and is awaiting further recommendations.    Explained process of SNF placement and family states they are ready to pursuing once considered appropriate.   Plan: TBD- potential SNF placement for ST rehab with palliative care. Will follow for additional PT/OT recommendations.   Employment status:  Retired Forensic scientist:  Medicare PT Recommendations:    Information / Referral to community resources:     Patient/Family's Response to care:  Pt sleeping- family appreciative of care.   Patient/Family's Understanding of and Emotional Response to Diagnosis, Current Treatment, and Prognosis:  Family expressed adequate understanding of potential plans and barriers. Emotional response was appropriate.   Emotional Assessment Appearance:  Appears stated age Attitude/Demeanor/Rapport:   (sleeping) Affect (typically observed):   (sleeping) Orientation:   (UTA- sleeping) Alcohol / Substance use:  Not Applicable Psych involvement (Current and /or in the community):  No (Comment)  Discharge Needs  Concerns to be addressed:  Discharge Planning Concerns Readmission within the last 30 days:  No Current discharge risk:  Dependent with Mobility Barriers to Discharge:  Continued Medical Work up   [employer], LCSW 10/23/2016, 4:26 PM  [phone]

## 2016-10-23 NOTE — Consult Note (Signed)
Consultation Note Date: 10/23/2016   Patient Name: Tommy Rivera  DOB: 02-Jun-1921  MRN: [id number]  Age / Sex: 83 y.o., male  PCP: Patient, No Pcp Per Referring Physician: Debbe Odea, MD  Reason for Consultation: Disposition, Establishing goals of care and Psychosocial/spiritual support  HPI/Patient Profile: 83 y.o. male  with past medical history of colon cancer (diagnosed 09/2015, declined treatment and has followed with Dr. Burr Medico with Oncology, and Dr. Carlean Purl with GI), neuropathy in feet, and hearing loss. He now presents to the ED with weight loss, generalized weakness, and frequent falls. He was admitted on 10/22/2016. Imaging revealed progression of his rectosigmoid carcinoma with invasion into the bladder wall and involvement of the distal right ureter causing increased right-sided obstruction. He also has a UTI and anemia, from ongoing cancer-associated rectal bleeding. In family report, significant evidence of progressive failure to thrive with weight loss and fatigue. Palliative consulted to assist in goals of care and disposition.  Clinical Assessment and Goals of Care: Mr. Tommy Rivera was asleep when I entered the room, his family at the bedside asked that I not wake him. At his bedside, I sat down with his wife, daughter, and granddaughter. They provided a detailed report of his health leading up to this hospitalization. From diagnosis to one month ago he had been doing well at home. He was driving to shops and functionally independent. They understood his cancer was slow growing, and his symptoms had been limited to intermittent diarrhea. One month ago there was a significant and progressive decline. He was sleeping up to 16 hours a day, loosing substantial weight (~20-25lb in the past month), and had increased weakness with recurrent mechanical falls. For the past month he has been predominantly house bound as his family has been unable to assist him down  steps and into a car. This hospitalization they understand that the imaging has shown cancer progression, and that he also has a UTI and dehydration.   In hearing this history, and in light of his cancer's progression, I expressed my concern that his decline is likely secondary to his cancer. His family was very surprised by this information, and very upset by this. They have been working under the assumption that his weakness, weight loss, and fatigue were reversible (though they were unclear on the cause of it). They viewed this hospitalization as a conduit for returning him to his functional status from a month prior. In terms of next steps, they are still very focused on pursuing options to optimize his functional status (and believe he will improve). They are also unable to provide adequate care at home given his recurrent falls and weakness.   We talked through the benefits and burdens of multiple discharge options. In light of their expressed goal of optimizing his functional status--founded in the belief that what he is experiencing can be reversed--I recommended pursuing SNF with rehab with palliative following. After rehab they plan to take him home with 24hr private duty care and Hospice support. I did explain the Hospice care philosophy and the extent of the support they can provide.   Primary Decision Maker NEXT OF KIN   SUMMARY OF RECOMMENDATIONS    DNR, continue to treat the treatable  Family agreeable to transfusion if primary team feels it is warranted  Discussed with CM, family provided list of private duty groups and Hospice providers  SW consulted to assist in SNF/rehab referral  Code Status/Advance Care Planning:  DNR  Psycho-social/Spiritual:   Desire for further Chaplaincy  support:no  Additional Recommendations: Education on Hospice  Prognosis:   < 3 months; pt with known progression of his colon cancer now with involvement in the bladder and distal right ureter  causing increased right-sided obstruction. Acute decline in the past month with significant weight loss, sleeping ~16 hours per day, and marked weakness.   Discharge Planning: [place] for rehab with Palliative care service follow-up      Primary Diagnoses: Present on Admission: . Cancer of left colon ([place]) . Hydronephrosis of right kidney . Metastatic colorectal cancer ([place]) . Normocytic anemia . UTI (urinary tract infection)   I have reviewed the medical record, interviewed the patient and family, and examined the patient. The following aspects are pertinent.  Past Medical History:  Diagnosis Date  . Anemia   . Brown recluse spider bite   . Colon cancer ([place])   . Depression   . History of blood transfusion   . Neuropathy   . 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 Marital status: Married    Spouse name: N/A  . Number of children: 2  . Years of education: N/A   Occupational History  . retired    Social History Main Topics  . Smoking status: Former Smoker    Packs/day: 1.00    Years: 20.00    Types: Cigarettes    Quit date: 05/07/1964  . Smokeless tobacco: Never Used  . Alcohol use 0.6 oz/week    1 Shots of liquor per week     Comment: he used to drink daily, not much lately   . Drug use: No  . Sexual activity: Not Asked   Other Topics Concern  . 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 Heart defect Mother   . Breast cancer Daughter   . Cancer Daughter 76       breast cancer  . Cancer Daughter 73       breast cancer   . Cancer Cousin        prostate cancer    Scheduled Meds: . gabapentin  300 mg Oral QHS   Continuous Infusions: .  ciprofloxacin     PRN Meds:.acetaminophen **OR** acetaminophen, guaiFENesin, ondansetron **OR** ondansetron (ZOFRAN) IV, oxyCODONE Allergies  Allergen Reactions  . Keflex [Cephalexin] Rash   Review of Systems: Unable to obtain, pt asleep and family requested I not wake him.  Limited as pt asleep: Physical Exam  Constitutional: He has a sickly appearance.  HENT:  Mouth/Throat: Oropharynx is clear and moist and mucous membranes are normal. Abnormal dentition.  Neck: Normal range of motion.  Pulmonary/Chest: Effort normal. No respiratory distress.  Musculoskeletal:  Spontaneously moves all extremities. Evidence of muscle wasting.   Neurological:  Asleep  Skin: There is pallor.   Vital Signs: BP (!) 145/48 (BP Location: Right Arm) Comment: RN notified  Pulse 75   Temp 100.2 F (37.9 C) (Oral)   Resp 20   Ht 5\' 7"  (1.702 m)   Wt 63.5 kg (139 lb 15.9 oz)   SpO2 96%   BMI 21.93 kg/m  Pain Assessment: 0-10 POSS *See Group Information*: 1-Acceptable,Awake and alert  Pain Score: 0-No pain   SpO2: SpO2: 96 % O2 Device:SpO2: 96 % O2 Flow Rate: .   IO: Intake/output summary:  Intake/Output Summary (Last 24 hours) at 10/23/16 0818 Last data filed at 10/23/16 3159  Gross per 24 hour  Intake             1.25 ml  Output              110 ml  Net          -108.75 ml    LBM: Last BM Date: 10/21/16 Baseline Weight: Weight: 60.3 kg (133 lb) Most recent weight: Weight: 63.5 kg (139 lb 15.9 oz)     Palliative Assessment/Data: PPS 40-50%   Time in: 1250 Time out: 1415 Time Total: 110 minutes Greater than 50%  of this time was spent counseling and coordinating care related to the above assessment and plan.  Signed by: Charlynn Court, NP Palliative Medicine Team Pager # [phone] (M-F 7a-5p) Team Phone # [phone] (Nights/Weekends)

## 2016-10-24 DIAGNOSIS — N390 Urinary tract infection, site not specified: Principal | ICD-10-CM

## 2016-10-24 LAB — BASIC METABOLIC PANEL
Anion gap: 6 (ref 5–15)
BUN: 15 mg/dL (ref 6–20)
CO2: 26 mmol/L (ref 22–32)
CREATININE: 1.28 mg/dL — AB (ref 0.61–1.24)
Calcium: 7.5 mg/dL — ABNORMAL LOW (ref 8.9–10.3)
Chloride: 104 mmol/L (ref 101–111)
GFR calc Af Amer: 54 mL/min — ABNORMAL LOW (ref >60)
GFR, EST NON AFRICAN AMERICAN: 46 mL/min — AB (ref >60)
GLUCOSE: 84 mg/dL (ref 65–99)
Potassium: 3.7 mmol/L (ref 3.5–5.1)
SODIUM: 136 mmol/L (ref 135–145)

## 2016-10-24 LAB — CBC
HCT: 29 % — ABNORMAL LOW (ref 39.0–52.0)
Hemoglobin: 9.2 g/dL — ABNORMAL LOW (ref 13.0–17.0)
MCH: 26.1 pg (ref 26.0–34.0)
MCHC: 31.7 g/dL (ref 30.0–36.0)
MCV: 82.2 fL (ref 78.0–100.0)
PLATELETS: 348 10*3/uL (ref 150–400)
RBC: 3.53 MIL/uL — ABNORMAL LOW (ref 4.22–5.81)
RDW: 15.9 % — AB (ref 11.5–15.5)
WBC: 13.6 10*3/uL — AB (ref 4.0–10.5)

## 2016-10-24 LAB — TYPE AND SCREEN
ABO/RH(D): O NEG
ANTIBODY SCREEN: NEGATIVE
UNIT DIVISION: 0

## 2016-10-24 LAB — TSH: TSH: 5.301 u[IU]/mL — ABNORMAL HIGH (ref 0.350–4.500)

## 2016-10-24 LAB — BPAM RBC
BLOOD PRODUCT EXPIRATION DATE: 201807182359
ISSUE DATE / TIME: 201806191650
UNIT TYPE AND RH: 9500

## 2016-10-24 MED ORDER — HYDROCORTISONE 1 % EX CREA
1.0000 | TOPICAL_CREAM | Freq: Three times a day (TID) | CUTANEOUS | Status: DC | PRN
Start: 2016-10-24 — End: 2016-10-25
  Filled 2016-10-24: qty 28

## 2016-10-24 NOTE — Progress Notes (Signed)
Tommy Rivera Demographics:    Tommy Rivera, is a 83 y.o. male, DOB - Jan 11, 1922, WUJ:[id number]  Admit date - 10/22/2016   Admitting Physician Bonnielee Haff, MD  Outpatient Primary MD for the Tommy Rivera is Tommy Rivera, No Pcp Per  LOS - 2   Chief Complaint  Tommy Rivera presents with  . Fall        Subjective:    Tommy Rivera today has no fevers, no emesis,  No chest pain,     Assessment  & Plan :    Principal Problem:   UTI (urinary tract infection) Active Problems:   Cancer of left colon (HCC)   CKD (chronic kidney disease), stage III   Hydronephrosis of right kidney   Metastatic colorectal cancer (HCC)   Normocytic anemia   Frequent falls   Debilitated   Weight loss   Ureteral obstruction   Goals of care, counseling/discussion   Palliative care by specialist   Malignant neoplasm of colon [facility])  Brief Narrative:  83 y/o male with colon cancer who has seen Dr Burr Medico, GI, urology and general surgery in the past. The rectosigmoid tumor was discovered on 09/15/15 and surgery was offered but was declined by Tommy Rivera and family as they felt it was too high a risk. The mass was obstructing the right ureter and it was decided not to place a ureter stent. Palliative chemo was not recommended due to his limited performance status. Palliative radiation was discussed in case obstruction or bleeding occurred.  Tommy Rivera is very very hard of hearing, he often will give  the wrong answer to a question, because he cannot hear the question correctly giving the wrong impression that he has severe cognitive deficits  1)E coli UTI- Continue Cipro pending sensitivities  2)Cancer of left colon/RectoSigmoid - progressing - - the rectosigmoid malignancy appears to be "invading" Tommy Rivera's urogenital system causing obstruction with weight loss, frequent falls- lives at home with his wife, not a candidate for treatments,  Palliative care  consulted.   3)CKD 3- monitor closely, avoid nephrotoxic agents  4)Anemia- probably related to  #2 and #3 above, may be contributing to Tommy Rivera's falls, transfuse prn  5)Dispo- possible discharge to skilled nursing facility for subacute rehabilitation, down the road Tommy Rivera may need hospice involvement  Code Status : DNR   Disposition Plan  : SNF  Consults  :  Palliative  DVT Prophylaxis  :   SCDs   Lab Results  Component Value Date   PLT 348 10/24/2016    Inpatient Medications  Scheduled Meds: . gabapentin  300 mg Oral QHS   Continuous Infusions: . ciprofloxacin 400 mg (10/24/16 1542)   PRN Meds:.acetaminophen **OR** acetaminophen, guaiFENesin, ondansetron **OR** ondansetron (ZOFRAN) IV, oxyCODONE    Anti-infectives    Start     Dose/Rate Route Frequency Ordered Stop   10/23/16 1400  ciprofloxacin (CIPRO) IVPB 400 mg     400 mg 200 mL/hr over 60 Minutes Intravenous Every 24 hours 10/22/16 1646     10/22/16 1400  ciprofloxacin (CIPRO) IVPB 400 mg     400 mg 200 mL/hr over 60 Minutes Intravenous  Once 10/22/16 1353 10/22/16 1527        Objective:   Vitals:   10/23/16 1915 10/23/16 1930 10/23/16 2247 10/24/16 0533  BP: (!) 144/44 (!) 155/52 (!) 165/56 (!) 163/50  Pulse: 66 65 66 65  Resp: 18 18 19 18   Temp: 98.4 F (36.9 C) 98.6 F (37 C) 98.8 F (37.1 C) 98.7 F (37.1 C)  TempSrc: Oral Oral Oral Oral  SpO2: 98% 98% 100% 95%  Weight:      Height:        Wt Readings from Last 3 Encounters:  10/22/16 63.5 kg (139 lb 15.9 oz)  08/23/16 71.8 kg (158 lb 6 oz)  03/30/16 79.4 kg (175 lb)     Intake/Output Summary (Last 24 hours) at 10/24/16 1738 Last data filed at 10/24/16 0800  Gross per 24 hour  Intake              635 ml  Output              495 ml  Net              140 ml     Physical Exam  Gen:- Awake Alert,  In no apparent distress  HEENT:- [place].AT, No sclera icterus, very very HOH Neck-Supple Neck,No JVD,.  Lungs-  CTAB  CV- S1, S2  normal Abd-  +ve B.Sounds, Abd Soft, No tenderness,    Extremity/Skin:- No  edema,       Data Review:   Micro Results Recent Results (from the past 240 hour(s))  Urine Culture     Status: Abnormal (Preliminary result)   Collection Time: 10/22/16 12:29 PM  Result Value Ref Range Status   Specimen Description URINE, RANDOM  Final   Special Requests NONE  Final   Culture >=100,000 COLONIES/mL ESCHERICHIA COLI (A)  Final   Report Status PENDING  Incomplete    Radiology Reports Dg Chest 2 View  Result Date: 10/22/2016 CLINICAL DATA:  One week of generalize weakness. Fell this morning. No current complaints. History of colonic malignancy. EXAM: CHEST  2 VIEW COMPARISON:  Chest x-ray of March 30, 2016 FINDINGS: The lungs are adequately inflated. There is no focal infiltrate. The interstitial markings are increased over baseline. The cardiac silhouette remains enlarged. The central pulmonary vascularity is mildly prominent. There is calcification in the wall of the thoracic aorta. IMPRESSION: Mild low-grade CHF superimposed upon chronic underlying bronchitic-smoking related changes. No acute pneumonia. Electronically Signed   By: David  Martinique M.D.   On: 10/22/2016 13:11   Ct Head Wo Contrast  Result Date: 10/22/2016 CLINICAL DATA:  Generalized weakness. EXAM: CT HEAD WITHOUT CONTRAST TECHNIQUE: Contiguous axial images were obtained from the base of the skull through the vertex without intravenous contrast. COMPARISON:  None. FINDINGS: Brain: Mild diffuse cortical atrophy is noted. Mild chronic ischemic white matter disease is noted. Old lacunar infarction is noted in right basal ganglia. No mass effect or midline shift is noted. Ventricular size is within normal limits. There is no evidence of mass lesion, hemorrhage or acute infarction. Vascular: Atherosclerosis carotid siphons is noted. Skull: Normal. Negative for fracture or focal lesion. Sinuses/Orbits: No acute finding. Other: None.  IMPRESSION: Mild diffuse cortical atrophy. Mild chronic ischemic white matter disease. No acute intracranial abnormality seen. Electronically Signed   By: Marijo Conception, M.D.   On: 10/22/2016 13:57   Ct Abdomen Pelvis W Contrast  Result Date: 10/22/2016 CLINICAL DATA:  Weakness EXAM: CT ABDOMEN AND PELVIS WITH CONTRAST TECHNIQUE: Multidetector CT imaging of the abdomen and pelvis was performed using the standard protocol following bolus administration of intravenous contrast. CONTRAST:  52mL ISOVUE-300 IOPAMIDOL (ISOVUE-300) INJECTION  61% COMPARISON:  01/05/2016 FINDINGS: Lower chest: Small bilateral pleural effusions are noted right greater than left. Dependent atelectatic changes are seen. No focal infiltrate or sizable effusion is noted. Coronary calcifications are noted. Hepatobiliary: No focal liver abnormality is seen. No gallstones, gallbladder wall thickening, or biliary dilatation. Pancreas: Unremarkable. No pancreatic ductal dilatation or surrounding inflammatory changes. Spleen: Normal in size without focal abnormality. Adrenals/Urinary Tract: The adrenal glands are within normal limits. The left kidney demonstrates a few small nonobstructing calculi. The largest of these lies in the lower pole measuring approximately 6 mm. An exophytic cyst is also seen. These changes are stable from the prior exam. The right kidney demonstrates significant obstructive change with irregular enhancement of the collecting system and proximal ureter suggesting a superimposed infection. The dilated right ureter extends inferiorly and has some nodular appearing enhancement identified best seen on image number 74 of series 2. Adjacent to this area is the known locally invasive rectosigmoid carcinoma. There is also apparent extension into the superior wall of the urinary bladder on the right. Bladder is partially distended. The invasive component measures at least 2 by 2.5 cm although actual measurements are difficult.  Stomach/Bowel: The known locally in the bases of the rectosigmoid carcinoma is again identified. The wall thickening has increased and the distribution of the tumor in the colon has increased. There is now significant extension into the bladder wall with a focal filling defect within the bladder wall. There is also been apparent extension into the distal right ureter increase in the degree of obstruction. The appendix is well visualized and within normal limits. There is a moderate fecal burden in the proximal colon consistent with constipation related to the underlying narrowing in the rectosigmoid. Vascular/Lymphatic: Aortic atherosclerosis. No enlarged abdominal or pelvic lymph nodes. Reproductive: .Prostate is well visualize with central defect consistent with prior surgery. Other: No abdominal wall hernia or abnormality. No abdominopelvic ascites. Musculoskeletal: Degenerative changes of the lumbar spine are noted. 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 Associated constipation is noted as well. Significant increase in the degree of obstruction in the right kidney. Enhancement of the walls of the collecting system and proximal ureters suggests superimposed infection. Bilateral small pleural effusions. Nonobstructing left renal stones. Electronically Signed   By: Inez Catalina M.D.   On: 10/22/2016 14:03     CBC  Recent Labs Lab 10/22/16 1156 10/23/16 0431 10/23/16 2144 10/24/16 0434  WBC 15.0* 14.5*  --  13.6*  HGB 8.4* 7.6* 8.9* 9.2*  HCT 26.6* 24.0* 27.9* 29.0*  PLT 419* 368  --  348  MCV 83.4 84.5  --  82.2  MCH 26.3 26.8  --  26.1  MCHC 31.6 31.7  --  31.7  RDW 15.9* 16.2*  --  15.9*    Chemistries   Recent Labs Lab 10/22/16 1156 10/23/16 0431 10/24/16 0434  NA 138 135 136  K 5.0 4.2 3.7  CL 104 106 104  CO2 28 25 26     GLUCOSE 96 91 84  BUN 18 18 15   CREATININE 1.50* 1.42* 1.28*  CALCIUM 8.2* 7.3* 7.5*  AST 12*  --   --   ALT 11*  --   --   ALKPHOS 74  --   --   BILITOT 0.5  --   --    ------------------------------------------------------------------------------------------------------------------ No results for input(s): CHOL, HDL, LDLCALC, TRIG, CHOLHDL, LDLDIRECT  in the last 72 hours.  No results found for: HGBA1C ------------------------------------------------------------------------------------------------------------------  Recent Labs  10/24/16 0434  TSH 5.301*   ------------------------------------------------------------------------------------------------------------------ No results for input(s): VITAMINB12, FOLATE, FERRITIN, TIBC, IRON, RETICCTPCT in the last 72 hours.  Coagulation profile No results for input(s): INR, PROTIME in the last 168 hours.  No results for input(s): DDIMER in the last 72 hours.  Cardiac Enzymes No results for input(s): CKMB, TROPONINI, MYOGLOBIN in the last 168 hours.  Invalid input(s): CK ------------------------------------------------------------------------------------------------------------------ No results found for: BNP   Desean Heemstra M.D on 10/24/2016 at 5:38 PM  Between 7am to 7pm - Pager - [phone]  After 7pm go to [email or web address] - password [email or web address]  [facility] -  Office  [phone]   Voice Recognition Viviann Spare dictation system was used to create this note, attempts have been made to correct errors. Please contact the author with questions and/or clarifications.

## 2016-10-24 NOTE — Evaluation (Signed)
Physical Therapy Evaluation Patient Details Name: Tommy Rivera MRN: [id number] DOB: Aug 04, 1921 Today's Date: 10/24/2016   History of Present Illness  Tommy Rivera is a 83 y.o. male with a past medical history of colon cancer, predominantly in the rectal area for which the patient elected no treatment/surgery. He otherwise does not have any significant health problems. He was brought in today because patient has been falling more frequently at home  Clinical Impression  Pt is progressing well; He is very HOH but Ox3 and able to follow all commands; amb 60' x 2 with RW and min/guard to min assist, no LOB; Pt family strongly desire pt to go to SNF prior to going home with Hospice so he can get stronger, POC updated    Follow Up Recommendations SNF (family desires SNF  )    Equipment Recommendations  3in1 (PT);Hospital bed;Wheelchair cushion (measurements PT) (defer to SNF)    Recommendations for Other Services       Precautions / Restrictions Precautions Precautions: Fall Restrictions Weight Bearing Restrictions: No      Mobility  Bed Mobility Overal bed mobility: Needs Assistance Bed Mobility: Supine to Sit;Sit to Supine     Supine to sit: Mod assist Sit to supine: +2 for physical assistance;Mod assist;Min assist   General bed mobility comments: assist to elevate trunk from flat bed, heavy use of rails; assist with LEs and trunk descent returning to supine  Transfers Overall transfer level: Needs assistance Equipment used: Rolling walker (2 wheeled) Transfers: Sit to/from Stand Sit to Stand: Min assist            Ambulation/Gait Ambulation/Gait assistance: Min assist;+2 safety/equipment Ambulation Distance (Feet): 60 Feet (x2, seated rest) Assistive device: Rolling walker (2 wheeled) Gait Pattern/deviations: Step-through pattern;Decreased stride length;Shuffle;Trunk flexed     General Gait Details: cues for posture, RW position and overall safety, requires min  assist throughout for balance  Stairs            Wheelchair Mobility    Modified Rankin (Stroke Patients Only)       Balance Overall balance assessment: Needs assistance;History of Falls             Standing balance comment: requires bil UE support and external assist to maintain static standing safely/ has had ~7 falls in last 3 wks                              Pertinent Vitals/Pain Pain Assessment: No/denies pain    Home Living                        Prior Function                 Hand Dominance        Extremity/Trunk Assessment                Communication      Cognition Arousal/Alertness: Awake/alert Behavior During Therapy: WFL for tasks assessed/performed Overall Cognitive Status: Within Functional Limits for tasks assessed                                        General Comments      Exercises     Assessment/Plan    PT Assessment    PT Problem List  PT Treatment Interventions      PT Goals (Current goals can be found in the Care Plan section)  Acute Rehab PT Goals Patient Stated Goal: pt would like to be able to get up OOB  PT Goal Formulation: With patient Time For Goal Achievement: 10/30/16 Potential to Achieve Goals: Poor    Frequency Min 3X/week   Barriers to discharge        Co-evaluation PT/OT/SLP Co-Evaluation/Treatment: Yes Reason for Co-Treatment: For patient/therapist safety;To address functional/ADL transfers           AM-PAC PT "6 Clicks" Daily Activity  Outcome Measure Difficulty turning over in bed (including adjusting bedclothes, sheets and blankets)?: Total Difficulty moving from lying on back to sitting on the side of the bed? : Total Difficulty sitting down on and standing up from a chair with arms (e.g., wheelchair, bedside commode, etc,.)?: Total Help needed moving to and from a bed to chair (including a wheelchair)?: A Little Help needed  walking in hospital room?: A Little Help needed climbing 3-5 steps with a railing? : A Lot 6 Click Score: 11    End of Session Equipment Utilized During Treatment: Gait belt Activity Tolerance: Patient tolerated treatment well Patient left: with call bell/phone within reach;in bed;with family/visitor present Nurse Communication: Mobility status PT Visit Diagnosis: Muscle weakness (generalized) (M62.81);Repeated falls (R29.6)    Time: 3254-9826 PT Time Calculation (min) (ACUTE ONLY): 19 min   Charges:     PT Treatments $Gait Training: 8-22 mins   PT G CodesKenyon Ana, PT Pager: [phone] 10/24/2016   Kenyon Ana 10/24/2016, 12:25 PM

## 2016-10-24 NOTE — Progress Notes (Signed)
CSW following for DC planning. Spoke with attending MD and pt/family. Agree to SNF referrals for ST rehab with goal of being able to return home once able to better ambulate. Family wants palliative care to follow as they understand pt'shealth is declining and want to have symptom management while in SNF and guidance for care decisions once pt returns home.   Completed FL2 and made referrals to area SNFs. Provided family with SNF list and will return with bed offers.   Sharren Bridge, MSW, LCSW Clinical Social Work 10/24/2016 [phone]

## 2016-10-24 NOTE — Progress Notes (Addendum)
Daily Progress Note   Patient Name: Tommy Rivera       Date: 10/24/2016 DOB: [birth year]  Age: 83 y.o. MRN#: [id number] Attending Physician: Roxan Hockey, MD Primary Care Physician: Patient, No Pcp Per Admit Date: 10/22/2016  Reason for Consultation/Follow-up: Disposition, Establishing goals of care and Psychosocial/spiritual support  Subjective: Tommy Rivera was awake and alert when I visited today. He was pleasantly conversational, though very hard of hearing. His main concern was being on falls precautions and his limited allowance to ambulate at will. I reinforced the value of preventing falls and reminded him that rehab after hospitalization would enable safer ambulation. He was receptive to this information, but remained annoyed by the bed alarm. I did have a chance to speak with his wife and daughter as they were exiting the room. They had no acute concerns.  Length of Stay: 2  Current Medications: Scheduled Meds:  . gabapentin  300 mg Oral QHS    Continuous Infusions: . ciprofloxacin Stopped (10/23/16 1535)    PRN Meds: acetaminophen **OR** acetaminophen, guaiFENesin, ondansetron **OR** ondansetron (ZOFRAN) IV, oxyCODONE  Physical Exam  Constitutional: He is oriented to person, place, and time. He appears well-developed. He has a sickly appearance.  Evidence of weight loss  HENT:  Head: Normocephalic and atraumatic.  Right Ear: Decreased hearing is noted.  Left Ear: Decreased hearing is noted.  Mouth/Throat: Oropharynx is clear and moist. Abnormal dentition. No oropharyngeal exudate.  Eyes: EOM are normal.  Neck: Normal range of motion. Neck supple.  Cardiovascular: Normal rate and regular rhythm.   Pulmonary/Chest: Effort normal. He has rhonchi.  Abdominal: Soft. Bowel sounds are normal.  Musculoskeletal: Normal range  of motion.  Generalized weakness, but poor insight into this  Neurological: He is alert and oriented to person, place, and time.  Skin: Skin is warm and dry. Abrasion and bruising noted.  Psychiatric: He has a normal mood and affect. His speech is normal and behavior is normal. Judgment and thought content normal. Cognition and memory are normal.           Vital Signs: BP (!) 163/50 (BP Location: Right Arm)   Pulse 65   Temp 98.7 F (37.1 C) (Oral)   Resp 18   Ht 5\' 7"  (1.702 m)   Wt 63.5 kg (139 lb 15.9 oz)   SpO2 95%   BMI 21.93 kg/m  SpO2: SpO2: 95 % O2 Device: O2 Device: Not Delivered O2 Flow Rate:    Intake/output summary:  Intake/Output Summary (Last 24 hours) at 10/24/16 1405 Last data filed at 10/24/16 0800  Gross per 24 hour  Intake              665 ml  Output              497 ml  Net              168 ml   LBM: Last BM Date: 10/24/16 Baseline Weight: Weight: 60.3 kg (133 lb) Most recent weight: Weight: 63.5 kg (139 lb 15.9 oz)  Palliative Assessment/Data: PPS 50-60%   Flowsheet Rows     Most Recent Value  Intake Tab  Referral Department  Hospitalist  Unit at Time of Referral  Oncology Unit  Palliative Care Primary Diagnosis  Cancer  Date Notified  10/22/16  Palliative Care Type  New Palliative care  Reason for referral  Clarify Goals of Care  Date of Admission  10/22/16  Date first seen by Palliative Care  10/23/16  # of days Palliative referral response time  1 Day(s)  # of days IP prior to Palliative referral  0  Clinical Assessment  Psychosocial & Spiritual Assessment  Palliative Care Outcomes      Patient Active Problem List   Diagnosis Date Noted  . Frequent falls 10/23/2016  . Debilitated 10/23/2016  . Weight loss 10/23/2016  . Ureteral obstruction   . Goals of care, counseling/discussion   . Palliative care by specialist   . Malignant neoplasm of colon ([place])   . Metastatic colorectal cancer ([place]) 10/22/2016  . Normocytic anemia  10/22/2016  . UTI (urinary tract infection) 10/22/2016  . 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 Hydronephrosis of right kidney 01/02/2016  . Anemia, iron deficiency 12/15/2015  . CKD (chronic kidney disease), stage III 12/15/2015  . Cancer of left colon ([place]) 09/15/2015    Palliative Care Assessment & Plan   HPI: 83 y.o. male  with past medical history of colon cancer (diagnosed 09/2015, declined treatment and has followed with Dr. Burr Medico with Oncology, and Dr. Carlean Purl with GI), neuropathy in feet, and hearing loss. He now presents to the ED with weight loss, generalized weakness, and frequent falls. He was admitted on 10/22/2016. Imaging revealed progression of his rectosigmoid carcinoma with invasion into the bladder wall and involvement of the distal right ureter causing increased right-sided obstruction. He also has a UTI and anemia, from ongoing cancer-associated rectal bleeding. In family report, significant evidence of progressive failure to thrive with weight loss and fatigue. Palliative consulted to assist in goals of care and disposition.  Assessment: After a long discussion with Tommy Rivera family on 6/19, the plan established was to pursue rehab for optimization of strength, then transition home with private duty support and Hospice. A list of private duty agencies and local agencies was provided to them on 6/19.  Today, I followed-up to ensure clear discharge plan. Tommy Rivera was very agreeable to trying rehab, and was hopeful for strength improvement. His wife and daughter were in the process of touring rehab locations, and know to contact Social Work with their desired locations. They are also starting the process of contacting private duty organizations to set up home aids for when he is ready for discharge. They are extremely appreciative of the support here, and feel comfortable with the plan for after discharge.   Recommendations/Plan:  DNR, continue to treat the  treatable  SW assisting in SNF/rehab referral  Goals of Care and Additional Recommendations:  Limitations on Scope of Treatment: Full Scope Treatment  Code Status:  DNR  Prognosis:   < 3 months: Pt with known progression of his colon cancer now with involvement in the bladder and distal right ureter causing increased right-sided obstruction. Acute decline in the past month with significant weight loss, sleeping ~16 hours per day, and marked weakness.   Discharge Planning:  [facility] for rehab with Palliative care service follow-up  Care plan was discussed with pt, pt's wife and daughter, SW, and primary team.  Thank you for allowing the Palliative Medicine Team to assist in the care of this patient.  Total time: 25 minutes    Greater than 50%  of this time was spent counseling  and coordinating care related to the above assessment and plan.  Charlynn Court, NP Palliative Medicine Team [phone] pager (7a-5p) Team Phone # [phone]

## 2016-10-24 NOTE — Evaluation (Addendum)
Occupational Therapy Evaluation Patient Details Name: Tommy Rivera MRN: [id number] DOB: [birth year] Today's Date: 10/24/2016    History of Present Illness Tommy Rivera is a 83 y.o. male with a past medical history of colon cancer, predominantly in the rectal area for which the patient elected no treatment/surgery. He otherwise does not have any significant health problems. He was brought in today because patient has been falling more frequently at home   Clinical Impression   Pt was admitted for the above.  He has had recent decline in status:  Had been independent with adls until recently.  He is very motivated and will benefit from continued OT in acute setting. Goals are are for min A to min guard.      Follow Up Recommendations  SNF    Equipment Recommendations  3 in 1 bedside commode    Recommendations for Other Services       Precautions / Restrictions Precautions Precautions: Fall Restrictions Weight Bearing Restrictions: No      Mobility Bed Mobility Overal bed mobility: Needs Assistance Bed Mobility: Supine to Sit;Sit to Supine     Supine to sit: Mod assist Sit to supine: +2 for physical assistance;Mod assist;Min assist   General bed mobility comments: assist to elevate trunk from flat bed, heavy use of rails; assist with LEs and trunk descent returning to supine  Transfers Overall transfer level: Needs assistance Equipment used: Rolling walker (2 wheeled) Transfers: Sit to/from Stand Sit to Stand: Min assist         General transfer comment: assist to rise and steady. Cues for UE placement    Balance Overall balance assessment: Needs assistance;History of Falls             Standing balance comment: requires bil UE support and external assist to maintain static standing safely/ has had ~7 falls in last 3 wks                            ADL either performed or assessed with clinical judgement   ADL Overall ADL's : Needs  assistance/impaired     Grooming: Set up;Sitting   Upper Body Bathing: Minimal assistance   Lower Body Bathing: Moderate assistance;Sit to/from stand   Upper Body Dressing : Minimal assistance;Sitting   Lower Body Dressing: Maximal assistance;Sit to/from stand   Toilet Transfer: Minimal assistance;Ambulation;RW             General ADL Comments: Pt very motivated; HOH.  May benefit from AE for energy conservation and to promote independence; did not use on this visit     Vision         Perception     Praxis      Pertinent Vitals/Pain Pain Assessment: Faces Faces Pain Scale: Hurts even more Pain Location: L side Pain Descriptors / Indicators: Sore Pain Intervention(s): Limited activity within patient's tolerance;Monitored during session;Repositioned     Hand Dominance     Extremity/Trunk Assessment Upper Extremity Assessment Upper Extremity Assessment: Overall WFL for tasks assessed (grossly 4/5; cuts on R arm)           Communication Communication Communication: HOH   Cognition Arousal/Alertness: Awake/alert Behavior During Therapy: WFL for tasks assessed/performed Overall Cognitive Status: Within Functional Limits for tasks assessed  General Comments       Exercises     Shoulder Instructions      Home Living Family/patient expects to be discharged to:: Skilled nursing facility Living Arrangements: Spouse/significant other                               Additional Comments: pt does not have DME for toilet      Prior Functioning/Environment          Comments: able to do ADLs until recently        OT Problem List: Decreased strength;Decreased activity tolerance;Impaired balance (sitting and/or standing);Decreased knowledge of use of DME or AE;Pain      OT Treatment/Interventions: Self-care/ADL training;DME and/or AE instruction;Patient/family education;Balance  training;Therapeutic activities;Energy conservation    OT Goals(Current goals can be found in the care plan section) Acute Rehab OT Goals Patient Stated Goal: pt would like to be able to get up OOB  OT Goal Formulation: With patient/family Time For Goal Achievement: 10/31/16 Potential to Achieve Goals: Good ADL Goals Pt Will Perform Lower Body Bathing: with min assist;with adaptive equipment;sit to/from stand Pt Will Perform Lower Body Dressing: with min assist;sit to/from stand;with adaptive equipment Pt Will Transfer to Toilet: with min guard assist;ambulating;bedside commode Additional ADL Goal #1: pt will perform UB adls with set up from seated position  OT Frequency: Min 2X/week   Barriers to D/C:            Co-evaluation PT/OT/SLP Co-Evaluation/Treatment: Yes Reason for Co-Treatment: For patient/therapist safety;To address functional/ADL transfers PT goals addressed during session: Mobility/safety with mobility OT goals addressed during session: ADL's and self-care      AM-PAC PT "6 Clicks" Daily Activity     Outcome Measure Help from another person eating meals?: None Help from another person taking care of personal grooming?: A Little Help from another person toileting, which includes using toliet, bedpan, or urinal?: A Little Help from another person bathing (including washing, rinsing, drying)?: A Lot Help from another person to put on and taking off regular upper body clothing?: A Little Help from another person to put on and taking off regular lower body clothing?: A Lot 6 Click Score: 17   End of Session    Activity Tolerance: Patient tolerated treatment well Patient left: in bed;with call bell/phone within reach;with family/visitor present  OT Visit Diagnosis: Unsteadiness on feet (R26.81);Muscle weakness (generalized) (M62.81)                Time: 3007-6226 OT Time Calculation (min): 25 min Charges:  OT General Charges $OT Visit: 1 Procedure OT  Evaluation $OT Eval Moderate Complexity: 1 Procedure G-Codes:     [place], OTR/L [phone] 10/24/2016  Loletta Harper 10/24/2016, 1:06 PM

## 2016-10-24 NOTE — NC FL2 (Signed)
MEDICAID FL2 LEVEL OF CARE SCREENING TOOL     IDENTIFICATION  Patient Name: Osha Errico Birthdate: 10-25-1921 Sex: male Admission Date (Current Location): 10/22/2016  [facility] and [place] Number:  Herbalist and Address:  [facility],  [place] [address] [place]      Provider Number: [id number]  Attending Physician Name and Address:  Roxan Hockey, MD  Relative Name and Phone Number:       Current Level of Care: Hospital Recommended Level of Care: [place] Prior Approval Number:    Date Approved/Denied:   PASRR Number: [id number]  Discharge Plan: SNF    Current Diagnoses: Patient Active Problem List   Diagnosis Date Noted  . Frequent falls 10/23/2016  . Debilitated 10/23/2016  . Weight loss 10/23/2016  . Ureteral obstruction   . Goals of care, counseling/discussion   . Palliative care by specialist   . Malignant neoplasm of colon ([place])   . Metastatic colorectal cancer ([place]) 10/22/2016  . Normocytic anemia 10/22/2016  . UTI (urinary tract infection) 10/22/2016  . 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 Hydronephrosis of right kidney 01/02/2016  . Anemia, iron deficiency 12/15/2015  . CKD (chronic kidney disease), stage III 12/15/2015  . Cancer of left colon ([place]) 09/15/2015    Orientation RESPIRATION BLADDER Height & Weight     Self, Time, Situation, Place  Normal Continent Weight: 139 lb 15.9 oz (63.5 kg) Height:  5\' 7"  (170.2 cm)  BEHAVIORAL SYMPTOMS/MOOD NEUROLOGICAL BOWEL NUTRITION STATUS      Continent Diet (regular, fluid consistency thin)  AMBULATORY STATUS COMMUNICATION OF NEEDS Skin   Extensive Assist Verbally Normal                       Personal Care Assistance Level of Assistance  Bathing, Feeding, Dressing Bathing Assistance: Limited assistance Feeding assistance: Independent Dressing Assistance: Limited assistance     Functional Limitations Info  Sight, Hearing,  Speech Sight Info: Adequate Hearing Info: Impaired (hard of hearing) Speech Info: Adequate    SPECIAL CARE FACTORS FREQUENCY  PT (By licensed PT), OT (By licensed OT)     PT Frequency: 5x OT Frequency: 5x            Contractures Contractures Info: Not present    Additional Factors Info  Code Status, Allergies Code Status Info: DNR Allergies Info: Keflex           Current Medications (10/24/2016):  This is the current hospital active medication list Current Facility-Administered Medications  Medication Dose Route Frequency Provider Last Rate Last Dose  . acetaminophen (TYLENOL) tablet 650 mg  650 mg Oral Q6H PRN Bonnielee Haff, MD       Or  . acetaminophen (TYLENOL) suppository 650 mg  650 mg Rectal Q6H PRN Bonnielee Haff, MD      . ciprofloxacin (CIPRO) IVPB 400 mg  400 mg Intravenous Q24H Nilda Simmer, RPH   Stopped at 10/23/16 1535  . gabapentin (NEURONTIN) capsule 300 mg  300 mg Oral QHS Bonnielee Haff, MD   300 mg at 10/23/16 2214  . guaiFENesin (ROBITUSSIN) 100 MG/5ML solution 100 mg  5 mL Oral Q4H PRN Bonnielee Haff, MD      . ondansetron [facility]) tablet 4 mg  4 mg Oral Q6H PRN Bonnielee Haff, MD       Or  . ondansetron [facility]) injection 4 mg  4 mg Intravenous Q6H PRN Bonnielee Haff, MD      .  oxyCODONE (Oxy IR/ROXICODONE) immediate release tablet 5 mg  5 mg Oral Q4H PRN Bonnielee Haff, MD         Discharge Medications: Please see discharge summary for a list of discharge medications.  Relevant Imaging Results:  Relevant Lab Results:   Additional Information SS# [id number]  Nila Nephew, LCSW

## 2016-10-25 DIAGNOSIS — Z9181 History of falling: Secondary | ICD-10-CM | POA: Diagnosis not present

## 2016-10-25 DIAGNOSIS — M6281 Muscle weakness (generalized): Secondary | ICD-10-CM | POA: Diagnosis not present

## 2016-10-25 DIAGNOSIS — R630 Anorexia: Secondary | ICD-10-CM | POA: Diagnosis not present

## 2016-10-25 DIAGNOSIS — R52 Pain, unspecified: Secondary | ICD-10-CM | POA: Diagnosis not present

## 2016-10-25 DIAGNOSIS — R2681 Unsteadiness on feet: Secondary | ICD-10-CM | POA: Diagnosis not present

## 2016-10-25 DIAGNOSIS — N390 Urinary tract infection, site not specified: Secondary | ICD-10-CM | POA: Diagnosis not present

## 2016-10-25 DIAGNOSIS — K219 Gastro-esophageal reflux disease without esophagitis: Secondary | ICD-10-CM | POA: Diagnosis not present

## 2016-10-25 DIAGNOSIS — H9190 Unspecified hearing loss, unspecified ear: Secondary | ICD-10-CM

## 2016-10-25 DIAGNOSIS — D649 Anemia, unspecified: Secondary | ICD-10-CM | POA: Diagnosis not present

## 2016-10-25 DIAGNOSIS — R279 Unspecified lack of coordination: Secondary | ICD-10-CM | POA: Diagnosis not present

## 2016-10-25 DIAGNOSIS — N3289 Other specified disorders of bladder: Secondary | ICD-10-CM | POA: Diagnosis not present

## 2016-10-25 DIAGNOSIS — R4182 Altered mental status, unspecified: Secondary | ICD-10-CM | POA: Diagnosis not present

## 2016-10-25 DIAGNOSIS — R634 Abnormal weight loss: Secondary | ICD-10-CM | POA: Diagnosis not present

## 2016-10-25 DIAGNOSIS — Z743 Need for continuous supervision: Secondary | ICD-10-CM | POA: Diagnosis not present

## 2016-10-25 DIAGNOSIS — C189 Malignant neoplasm of colon, unspecified: Secondary | ICD-10-CM | POA: Diagnosis not present

## 2016-10-25 DIAGNOSIS — K5900 Constipation, unspecified: Secondary | ICD-10-CM | POA: Diagnosis not present

## 2016-10-25 DIAGNOSIS — F339 Major depressive disorder, recurrent, unspecified: Secondary | ICD-10-CM | POA: Diagnosis not present

## 2016-10-25 DIAGNOSIS — N1330 Unspecified hydronephrosis: Secondary | ICD-10-CM | POA: Diagnosis not present

## 2016-10-25 DIAGNOSIS — K567 Ileus, unspecified: Secondary | ICD-10-CM | POA: Diagnosis not present

## 2016-10-25 DIAGNOSIS — G619 Inflammatory polyneuropathy, unspecified: Secondary | ICD-10-CM | POA: Diagnosis not present

## 2016-10-25 DIAGNOSIS — R296 Repeated falls: Secondary | ICD-10-CM | POA: Diagnosis not present

## 2016-10-25 DIAGNOSIS — D509 Iron deficiency anemia, unspecified: Secondary | ICD-10-CM | POA: Diagnosis not present

## 2016-10-25 DIAGNOSIS — C7911 Secondary malignant neoplasm of bladder: Secondary | ICD-10-CM | POA: Diagnosis not present

## 2016-10-25 LAB — BASIC METABOLIC PANEL WITH GFR
Anion gap: 8 (ref 5–15)
BUN: 15 mg/dL (ref 6–20)
CO2: 25 mmol/L (ref 22–32)
Calcium: 7.8 mg/dL — ABNORMAL LOW (ref 8.9–10.3)
Chloride: 102 mmol/L (ref 101–111)
Creatinine, Ser: 1.48 mg/dL — ABNORMAL HIGH (ref 0.61–1.24)
GFR calc Af Amer: 45 mL/min — ABNORMAL LOW (ref >60)
GFR calc non Af Amer: 39 mL/min — ABNORMAL LOW (ref >60)
Glucose, Bld: 202 mg/dL — ABNORMAL HIGH (ref 65–99)
Potassium: 3.5 mmol/L (ref 3.5–5.1)
Sodium: 135 mmol/L (ref 135–145)

## 2016-10-25 LAB — URINE CULTURE

## 2016-10-25 LAB — CBC
HEMATOCRIT: 33.1 % — AB (ref 39.0–52.0)
Hemoglobin: 10.4 g/dL — ABNORMAL LOW (ref 13.0–17.0)
MCH: 26.3 pg (ref 26.0–34.0)
MCHC: 31.4 g/dL (ref 30.0–36.0)
MCV: 83.8 fL (ref 78.0–100.0)
Platelets: 432 10*3/uL — ABNORMAL HIGH (ref 150–400)
RBC: 3.95 MIL/uL — AB (ref 4.22–5.81)
RDW: 16.1 % — AB (ref 11.5–15.5)
WBC: 15 10*3/uL — ABNORMAL HIGH (ref 4.0–10.5)

## 2016-10-25 MED ORDER — CIPROFLOXACIN HCL 500 MG PO TABS
500.0000 mg | ORAL_TABLET | ORAL | Status: DC
  Administered 2016-10-25: 500 mg via ORAL
  Filled 2016-10-25: qty 1

## 2016-10-25 MED ORDER — CIPROFLOXACIN HCL 500 MG PO TABS: 500.0000 mg | ORAL_TABLET | ORAL | 0 refills | Status: AC

## 2016-10-25 MED ORDER — ENSURE ENLIVE PO LIQD: 237.0000 mL | Freq: Two times a day (BID) | ORAL | 12 refills | Status: AC

## 2016-10-25 MED ORDER — OXYCODONE HCL 5 MG PO TABS: 5.0000 mg | ORAL_TABLET | ORAL | 0 refills | Status: AC | PRN

## 2016-10-25 MED ORDER — ENSURE ENLIVE PO LIQD: 237.0000 mL | Freq: Two times a day (BID) | ORAL | Status: DC

## 2016-10-25 MED ORDER — BOOST PLUS PO LIQD
237.0000 mL | ORAL | Status: DC
  Filled 2016-10-25: qty 237

## 2016-10-25 MED ORDER — FERROUS SULFATE 325 (65 FE) MG PO TBEC: 325.0000 mg | DELAYED_RELEASE_TABLET | Freq: Two times a day (BID) | ORAL | 3 refills | Status: AC

## 2016-10-25 NOTE — Discharge Instructions (Signed)
Repeat CBC within 1 week Repeat basic metabolic profile in one week

## 2016-10-25 NOTE — Clinical Social Work Placement (Signed)
Pt discharging today to [facility] SN[facility] room 609-A. See below for placement details Pt will transport via [place] completed medical necessity form and called to arrange transport. Family notified- wife and daughter at bedside. All information sent to facility via the [place]. Report # for RN: [phone]  CLINICAL SOCIAL WORK PLACEMENT  NOTE  Date:  10/25/2016  Patient Details  Name: Tommy Rivera MRN: [id number] Date of Birth: [birth year]  Clinical Social Work is seeking post-discharge placement for this patient at the [place] level of care (*CSW will initial, date and re-position this form in  chart as items are completed):  Yes   Patient/family provided with [place] Work Department's list of facilities offering this level of care within the geographic area requested by the patient (or if unable, by the patient's family).  Yes   Patient/family informed of their freedom to choose among providers that offer the needed level of care, that participate in Medicare, Medicaid or managed care program needed by the patient, have an available bed and are willing to accept the patient.  Yes   Patient/family informed of [place]'s ownership interest in [facility] and [facility], as well as of the fact that they are under no obligation to receive care at these facilities.  PASRR submitted to EDS on       PASRR number received on 10/24/16     Existing PASRR number confirmed on       FL2 transmitted to all facilities in geographic area requested by pt/family on 10/24/16     FL2 transmitted to all facilities within larger geographic area on       Patient informed that his/her managed care company has contracts with or will negotiate with certain facilities, including the following:        Yes   Patient/family informed of bed offers received.  Patient chooses bed at [facility]     Physician recommends and patient chooses bed at [facility]     Patient to be transferred to [facility] on 10/25/16.  Patient to be transferred to facility by [facility]     Patient family notified on 10/25/16 of transfer.  Name of family member notified:  Wife Patsy and Daughter Olin Hauser     PHYSICIAN       Additional Comment:    _______________________________________________ Nila Nephew, LCSW 10/25/2016, 3:26 PM

## 2016-10-25 NOTE — Progress Notes (Signed)
Pharmacy Antibiotic Note  Tommy Rivera is a 83 y.o. male admitted on 10/22/2016 with UTI. PMH significant for metastatic colon cancer.  Pharmacy has been consulted for Cipro dosing. Today is day 4 antibiotics.  Urine culture showed pan-sensitive E Coli. Cephalexin allergy noted without history of other cefalosporin use here. The patient has been afebrile with WBC trending down. His serum creatinine has come down, with CrCL ~30 mL/min. He is taking oral medications.   Plan: Cipro 500mg  PO q24h Follow renal function, stop date (5-7 days)  Height: 5\' 7"  (170.2 cm) Weight: 139 lb 15.9 oz (63.5 kg) IBW/kg (Calculated) : 66.1  Temp (24hrs), Avg:99 F (37.2 C), Min:98.6 F (37 C), Max:99.6 F (37.6 C)   Recent Labs Lab 10/22/16 1156 10/23/16 0431 10/24/16 0434  WBC 15.0* 14.5* 13.6*  CREATININE 1.50* 1.42* 1.28*    Estimated Creatinine Clearance: 32.4 mL/min (A) (by C-G formula based on SCr of 1.28 mg/dL (H)).    Allergies  Allergen Reactions  . Keflex [Cephalexin] Rash    Antimicrobials this admission: 6/18 Cipro >>    Dose adjustments this admission:    Microbiology results: 6/18 UCx: Pan-S E Coli   Thank you for allowing pharmacy to be a part of this patient's care.  Demetrius Charity, PharmD Acute Care Pharmacy Resident  Pager: [phone] 10/25/2016

## 2016-10-25 NOTE — Discharge Summary (Signed)
Tommy Rivera, is a 83 y.o. male  DOB [birth year]  MRN [id number].  Admission date:  10/22/2016  Admitting Physician  Bonnielee Haff, MD  Discharge Date:  10/25/2016   Primary MD  Patient, No Pcp Per  Recommendations for primary care physician for things to follow:   Repeat CBC within 1 week Repeat basic metabolic profile in one week  Admission Diagnosis  Ureteral obstruction [N13.5] Acute cystitis without hematuria [N30.00] Malignant neoplasm of colon, unspecified part of colon ([place]) [C18.9]   Discharge Diagnosis  Ureteral obstruction [N13.5] Acute cystitis without hematuria [N30.00] Malignant neoplasm of colon, unspecified part of colon ([place]) [C18.9]    Principal Problem:   UTI (urinary tract infection) Active Problems:   Cancer of left colon ([place])   CKD (chronic kidney disease), stage III   Hydronephrosis of right kidney   Metastatic colorectal cancer ([place])   Normocytic anemia   Frequent falls   Debilitated   Weight loss   Ureteral obstruction   Goals of care, counseling/discussion   Palliative care by specialist   Malignant neoplasm of colon [facility])   Hearing loss/Very Very HOH      Past Medical History:  Diagnosis Date  . Anemia   . Brown recluse spider bite   . Colon cancer ([place])   . Depression   . History of blood transfusion   . Neuropathy   . 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 CATARACT EXTRACTION    . PROSTATE SURGERY  2001       HPI  from the history and physical done on the day of admission:    Specialists: Dr. Carlean Purl is his gastroenterologist. Dr. Burr Medico is his oncologist.  Chief Complaint: Weight loss, generalized weakness, frequent falls  HPI: Tommy Rivera is a 83 y.o. male with a past medical history of colon cancer, predominantly in the rectal area for which the patient elected no  treatment/surgery. He otherwise does not have any significant health problems. He was brought in today by his wife and daughter, because patient has been falling more frequently at home. Most of these have been mechanical falls. Patient is very hard of hearing so history was somewhat limited. Lot of information was provided by the patient's wife and daughter. Patient has loose stools at baseline, which has blood in it due to his cancer. This hasn't changed much. Denies any difficulty with urinating. Denies any abdominal pain. Some nausea but no vomiting. But has had very poor appetite the last many weeks. He's lost more than 20 pounds in the last 2-3 months. Has not been able to ambulate today due to fatigue. Denies any pain in his legs.  In the emergency department, patient was found to be dehydrated. He had an abnormal UA. CT scan shows progression of his cancer.     Hospital Course:     Brief Narrative: 83 y/o male with colon cancer who has seen Dr Burr Medico, GI, urology and general surgery in the past. The rectosigmoid  tumor was discovered on 09/15/15 and surgery was offered but was declined by patient and family as they felt it was too high a risk. The mass was obstructing the right ureter and it was decided not to place a ureter stent. Palliative chemo was not recommended due to his limited performance status. Palliative radiation was discussed in case obstruction or bleeding occurred.  Patient is very very hard of hearing, he often will give  the wrong answer to a question, because he cannot hear the question correctly giving the wrong impression that he has severe cognitive deficits  Plan:- 1)E coli UTI- Urine culture with pansensitive Escherichia coli, okay to continue Cipro for another 5 days (adjusted for renal function). Persistent leukocytosis noted, however no fevers, no dysuria, no flank pain  2)Cancer of left colon/RectoSigmoid - progressing - - the rectosigmoid malignancy appears to be  "invading" patient's urogenital system causing obstruction with weight loss, frequent falls- lives at home with his wife, not a candidate for treatments,  Palliative care consult appreciated. Patient and family would consider hospice at home upon discharge from skilled nursing facility   3)CKD 3- avoid nephrotoxic agents, maintain adequate hydration and repeat BMP within a week  4)Anemia- probably related to  #2 and #3 above, given iron supplementation, no overt bleeding, repeat CBC within a week  5)Dispo-  discharge to skilled nursing facility for subacute rehabilitation. Palliative care consult appreciated. Patient and family would consider hospice at home upon discharge from skilled nursing facility    Code Status : DNR  Disposition Plan  : SNF  Consults  :  Palliative  DVT Prophylaxis  :   SCDs   Discharge Condition: fair  Follow UP  Contact information for after-discharge care    Destination    [facility] Follow up.   Specialty:  [place] information: [address] [place] [phone]              Diet and Activity recommendation:  As advised  Discharge Instructions    Discharge Instructions    Call MD for:  difficulty breathing, headache or visual disturbances    Complete by:  As directed    Call MD for:  persistant dizziness or light-headedness    Complete by:  As directed    Call MD for:  persistant nausea and vomiting    Complete by:  As directed    Call MD for:  severe uncontrolled pain    Complete by:  As directed    Call MD for:  temperature >100.4    Complete by:  As directed    Diet - low sodium heart healthy    Complete by:  As directed    Discharge instructions    Complete by:  As directed    Repeat CBC within 1 week Repeat basic metabolic profile in one week   Increase activity slowly    Complete by:  As directed         Discharge Medications     Allergies as of  10/25/2016      Reactions   Keflex [cephalexin] Rash      Medication List    TAKE these medications   ciprofloxacin 500 MG tablet Commonly known as:  CIPRO Take 1 tablet (500 mg total) by mouth daily. Start taking on:  10/26/2016   feeding supplement (ENSURE ENLIVE) Liqd Take 237 mLs by mouth 2 (two) times daily between meals.   ferrous sulfate 325 (65 FE) MG  EC tablet Take 1 tablet (325 mg total) by mouth 2 (two) times daily with a meal.   gabapentin 600 MG tablet Commonly known as:  NEURONTIN Take 600 mg by mouth at bedtime.   oxyCODONE 5 MG immediate release tablet Commonly known as:  Oxy IR/ROXICODONE Take 1 tablet (5 mg total) by mouth every 4 (four) hours as needed for moderate pain.       Major procedures and Radiology Reports - PLEASE review detailed and final reports for all details, in brief -   Dg Chest 2 View  Result Date: 10/22/2016 CLINICAL DATA:  One week of generalize weakness. Fell this morning. No current complaints. History of colonic malignancy. EXAM: CHEST  2 VIEW COMPARISON:  Chest x-ray of March 30, 2016 FINDINGS: The lungs are adequately inflated. There is no focal infiltrate. The interstitial markings are increased over baseline. The cardiac silhouette remains enlarged. The central pulmonary vascularity is mildly prominent. There is calcification in the wall of the thoracic aorta. IMPRESSION: Mild low-grade CHF superimposed upon chronic underlying bronchitic-smoking related changes. No acute pneumonia. Electronically Signed   By: David  Martinique M.D.   On: 10/22/2016 13:11   Ct Head Wo Contrast  Result Date: 10/22/2016 CLINICAL DATA:  Generalized weakness. EXAM: CT HEAD WITHOUT CONTRAST TECHNIQUE: Contiguous axial images were obtained from the base of the skull through the vertex without intravenous contrast. COMPARISON:  None. FINDINGS: Brain: Mild diffuse cortical atrophy is noted. Mild chronic ischemic white matter disease is noted. Old lacunar  infarction is noted in right basal ganglia. No mass effect or midline shift is noted. Ventricular size is within normal limits. There is no evidence of mass lesion, hemorrhage or acute infarction. Vascular: Atherosclerosis carotid siphons is noted. Skull: Normal. Negative for fracture or focal lesion. Sinuses/Orbits: No acute finding. Other: None. IMPRESSION: Mild diffuse cortical atrophy. Mild chronic ischemic white matter disease. No acute intracranial abnormality seen. Electronically Signed   By: Marijo Conception, M.D.   On: 10/22/2016 13:57   Ct Abdomen Pelvis W Contrast  Result Date: 10/22/2016 CLINICAL DATA:  Weakness EXAM: CT ABDOMEN AND PELVIS WITH CONTRAST TECHNIQUE: Multidetector CT imaging of the abdomen and pelvis was performed using the standard protocol following bolus administration of intravenous contrast. CONTRAST:  26mL ISOVUE-300 IOPAMIDOL (ISOVUE-300) INJECTION 61% COMPARISON:  01/05/2016 FINDINGS: Lower chest: Small bilateral pleural effusions are noted right greater than left. Dependent atelectatic changes are seen. No focal infiltrate or sizable effusion is noted. Coronary calcifications are noted. Hepatobiliary: No focal liver abnormality is seen. No gallstones, gallbladder wall thickening, or biliary dilatation. Pancreas: Unremarkable. No pancreatic ductal dilatation or surrounding inflammatory changes. Spleen: Normal in size without focal abnormality. Adrenals/Urinary Tract: The adrenal glands are within normal limits. The left kidney demonstrates a few small nonobstructing calculi. The largest of these lies in the lower pole measuring approximately 6 mm. An exophytic cyst is also seen. These changes are stable from the prior exam. The right kidney demonstrates significant obstructive change with irregular enhancement of the collecting system and proximal ureter suggesting a superimposed infection. The dilated right ureter extends inferiorly and has some nodular appearing enhancement  identified best seen on image number 74 of series 2. Adjacent to this area is the known locally invasive rectosigmoid carcinoma. There is also apparent extension into the superior wall of the urinary bladder on the right. Bladder is partially distended. The invasive component measures at least 2 by 2.5 cm although actual measurements are difficult. Stomach/Bowel: The known locally in the bases  of the rectosigmoid carcinoma is again identified. The wall thickening has increased and the distribution of the tumor in the colon has increased. There is now significant extension into the bladder wall with a focal filling defect within the bladder wall. There is also been apparent extension into the distal right ureter increase in the degree of obstruction. The appendix is well visualized and within normal limits. There is a moderate fecal burden in the proximal colon consistent with constipation related to the underlying narrowing in the rectosigmoid. Vascular/Lymphatic: Aortic atherosclerosis. No enlarged abdominal or pelvic lymph nodes. Reproductive: .Prostate is well visualize with central defect consistent with prior surgery. Other: No abdominal wall hernia or abnormality. No abdominopelvic ascites. Musculoskeletal: Degenerative changes of the lumbar spine are noted. 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 Associated constipation is noted as well. Significant increase in the degree of obstruction in the right kidney. Enhancement of the walls of the collecting system and proximal ureters suggests superimposed infection. Bilateral small pleural effusions. Nonobstructing left renal stones. Electronically Signed   By: Inez Catalina M.D.   On: 10/22/2016 14:03    Micro Results   Recent Results (from the past 240 hour(s))  Urine Culture     Status: Abnormal    Collection Time: 10/22/16 12:29 PM  Result Value Ref Range Status   Specimen Description URINE, RANDOM  Final   Special Requests NONE  Final   Culture >=100,000 COLONIES/mL ESCHERICHIA COLI (A)  Final   Report Status 10/25/2016 FINAL  Final   Organism ID, Bacteria ESCHERICHIA COLI (A)  Final      Susceptibility   Escherichia coli - MIC*    AMPICILLIN <=2 SENSITIVE Sensitive     CEFAZOLIN <=4 SENSITIVE Sensitive     CEFTRIAXONE <=1 SENSITIVE Sensitive     CIPROFLOXACIN <=0.25 SENSITIVE Sensitive     GENTAMICIN <=1 SENSITIVE Sensitive     IMIPENEM <=0.25 SENSITIVE Sensitive     NITROFURANTOIN <=16 SENSITIVE Sensitive     TRIMETH/SULFA <=20 SENSITIVE Sensitive     AMPICILLIN/SULBACTAM <=2 SENSITIVE Sensitive     PIP/TAZO <=4 SENSITIVE Sensitive     Extended ESBL NEGATIVE Sensitive     * >=100,000 COLONIES/mL ESCHERICHIA COLI       Today   Subjective    Tommy Rivera today has no new complaints, is very hard of hearing, wife and daughter at bedside, questions answered          Patient has been seen and examined prior to discharge   Objective   Blood pressure (!) 152/59, pulse 70, temperature 98.3 F (36.8 C), temperature source Oral, resp. rate 16, height 5\' 7"  (1.702 m), weight 63.5 kg (139 lb 15.9 oz), SpO2 99 %.   Intake/Output Summary (Last 24 hours) at 10/25/16 1514 Last data filed at 10/25/16 1059  Gross per 24 hour  Intake              120 ml  Output                0 ml  Net              120 ml    Exam Gen:- Awake  In no apparent distress  HEENT:- [place].AT,  Very very HOH Neck-Supple Neck,No JVD,  Lungs- mostly clear  CV- S1, S2 normal Abd-  +ve B.Sounds, Abd Soft, No tenderness,    Extremity/Skin:-  Intact peripheral pulses     Data Review   CBC w Diff:  Lab Results  Component Value Date   WBC 15.0 (H) 10/25/2016   HGB 10.4 (L) 10/25/2016   HGB 11.9 (L) 02/29/2016   HCT 33.1 (L) 10/25/2016   HCT 37.5 (L) 02/29/2016   PLT 432 (H) 10/25/2016   PLT  282 02/29/2016   LYMPHOPCT 10.1 (L) 08/23/2016   LYMPHOPCT 9.2 (L) 02/29/2016   MONOPCT 8.3 08/23/2016   MONOPCT 10.0 02/29/2016   EOSPCT 4.0 08/23/2016   EOSPCT 3.9 02/29/2016   BASOPCT 0.7 08/23/2016   BASOPCT 0.7 02/29/2016    CMP:  Lab Results  Component Value Date   NA 135 10/25/2016   NA 141 02/29/2016   K 3.5 10/25/2016   K 4.5 02/29/2016   CL 102 10/25/2016   CO2 25 10/25/2016   CO2 24 02/29/2016   BUN 15 10/25/2016   BUN 22.8 02/29/2016   CREATININE 1.48 (H) 10/25/2016   CREATININE 1.6 (H) 02/29/2016   PROT 6.0 (L) 10/22/2016   PROT 6.4 02/29/2016   ALBUMIN 2.0 (L) 10/22/2016   ALBUMIN 2.9 (L) 02/29/2016   BILITOT 0.5 10/22/2016   BILITOT 0.44 02/29/2016   ALKPHOS 74 10/22/2016   ALKPHOS 89 02/29/2016   AST 12 (L) 10/22/2016   AST 9 02/29/2016   ALT 11 (L) 10/22/2016   ALT <6 02/29/2016  .   Total Discharge time is about 33 minutes  Tommy Rivera M.D on 10/25/2016 at 3:14 PM  [facility]   Office  [phone]  Voice Recognition Viviann Spare dictation system was used to create this note, attempts have been made to correct errors. Please contact the author with questions and/or clarifications.

## 2016-10-25 NOTE — Progress Notes (Signed)
Initial Nutrition Assessment  DOCUMENTATION CODES:   Severe malnutrition in context of chronic illness  INTERVENTION:   -Provide Ensure Enlive po BID, each supplement provides 350 kcal and 20 grams of protein -Provide Boost Plus chocolate daily- Each supplement provides 360kcal and 14g protein.   -Encouraged family to provide anything the patient wants/craves  -RD to continue to monitor  NUTRITION DIAGNOSIS:   Malnutrition(severe) related to chronic illness, cancer and cancer related treatments as evidenced by percent weight loss, energy intake < or equal to 75% for > or equal to 1 month, severe depletion of body fat, severe depletion of muscle mass.  GOAL:   Patient will meet greater than or equal to 90% of their needs  MONITOR:   PO intake, Supplement acceptance, Labs, Weight trends, I & O's  REASON FOR ASSESSMENT:   Malnutrition Screening Tool    ASSESSMENT:   83 y/o male with colon cancer who has seen Dr Burr Medico, GI, urology and general surgery in the past. The rectosigmoid tumor was discovered on 09/15/15 and surgery was offered but was declined by patient and family as they felt it was too high a risk. The mass was obstructing the right ureter and it was decided not to place a ureter stent. Palliative chemo was not recommended due to his limited performance status. Palliative radiation was discussed in case obstruction or bleeding occurred.  Patient is very very hard of hearing, he often will give  the wrong answer to a question, because he cannot hear the question correctly giving the wrong impression that he has severe cognitive deficits  Patient in room with wife and daughter at bedside. Pt very HOH. Most history provided from family. Per family, pt has been eating very poorly for the past 2 months when he began to decline. Pt sleeping more during the day, frequently falling and experiencing taste changes where he states foods he once liked taste "bad". Pt's family have been  bringing him meals and foods to try. He ate 1/2 of a [employer] burger the other day and 1/2 of a [employer] roast beef and chips yesterday. This morning he ate a little cereal and tried a sausage biscuit. The family was thinking of bringing him a milkshake which this RD encouraged. They are also interested in the patient trying Ensure or Boost as well. RD to order.  Family reports the patient has lost 25-30 lb. Per chart review, pt has lost 36 lb since November 2017 (20% wt loss x 7 months, significant for time frame). Pt having a bath at time of visit, noted severe muscle and fat depletion.   Medications reviewed.  Labs reviewed: GFR: 46  Diet Order:  Diet regular Room service appropriate? Yes; Fluid consistency: Thin  Skin:  Reviewed, no issues  Last BM:  6/20  Height:   Ht Readings from Last 1 Encounters:  10/22/16 5\' 7"  (1.702 m)    Weight:   Wt Readings from Last 1 Encounters:  10/22/16 139 lb 15.9 oz (63.5 kg)    Ideal Body Weight:  67.3 kg  BMI:  Body mass index is 21.93 kg/m.  Estimated Nutritional Needs:   Kcal:  1700-1900  Protein:  80-90g  Fluid:  1.9L/day  EDUCATION NEEDS:   Education needs addressed  Clayton Bibles, MS, RD, LDN Pager: [phone] After Hours Pager: [phone]

## 2016-10-26 DIAGNOSIS — N390 Urinary tract infection, site not specified: Secondary | ICD-10-CM | POA: Diagnosis not present

## 2016-10-26 DIAGNOSIS — C189 Malignant neoplasm of colon, unspecified: Secondary | ICD-10-CM | POA: Diagnosis not present

## 2016-10-26 DIAGNOSIS — D649 Anemia, unspecified: Secondary | ICD-10-CM | POA: Diagnosis not present

## 2016-12-05 DEATH — deceased

## 2017-07-25 IMAGING — CT CT ABD-PELV W/ CM
2 of 5 series · 15 of 46 positions shown, 17 images · IV contrast (ISOVUE)
Comparison: 01/05/2016

CLINICAL DATA: Weakness

EXAM:
CT ABDOMEN AND PELVIS WITH CONTRAST
TECHNIQUE: Multidetector CT imaging of the abdomen and pelvis was performed
using the standard protocol following bolus administration of
intravenous contrast.
CONTRAST:  60mL WANZAJ-NUU IOPAMIDOL (WANZAJ-NUU) INJECTION 61%

[Series 2: abd/pel with · axial · 0.81mm/px · z∈[-850,-424]mm · 12 of 99 slices shown, 14 images]
[im 7/99  soft-tissue]
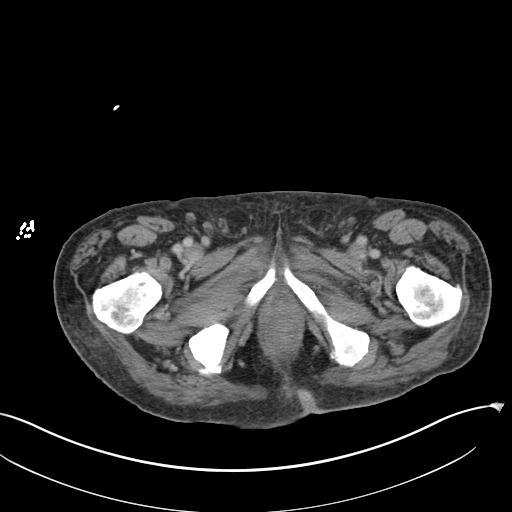
[im 7/99  bone]
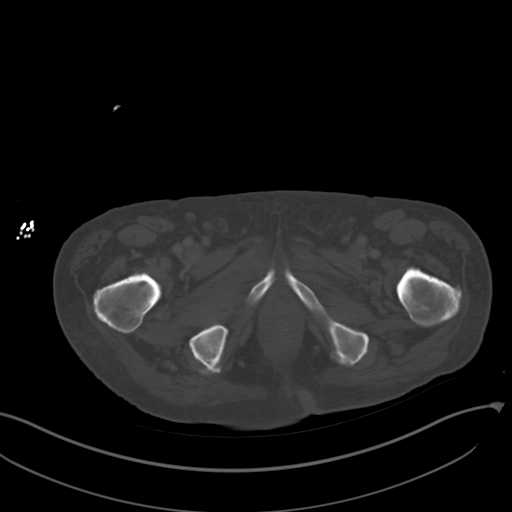
[im 14/99  soft-tissue]
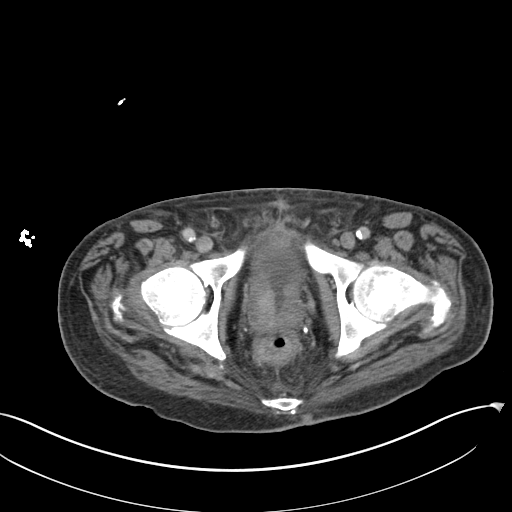
[im 20/99  soft-tissue]
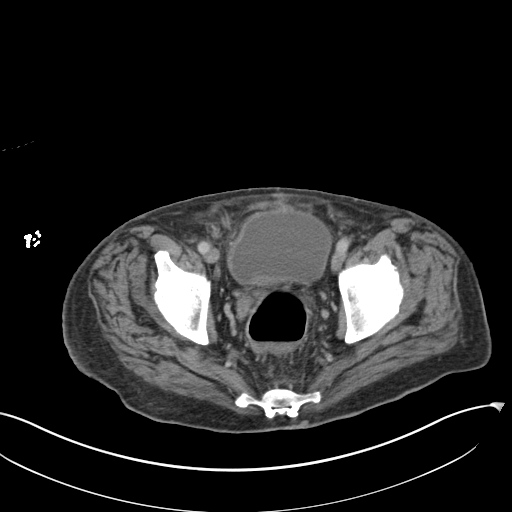
[im 33/99  soft-tissue]
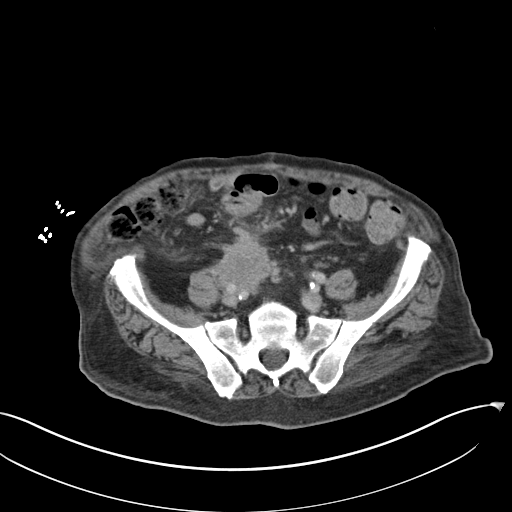
[im 40/99  soft-tissue]
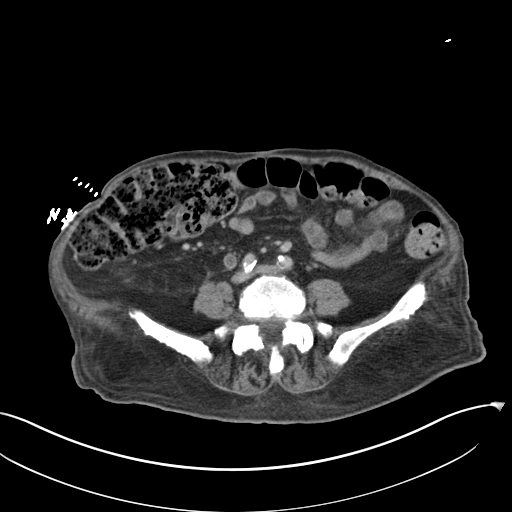
[im 46/99  soft-tissue]
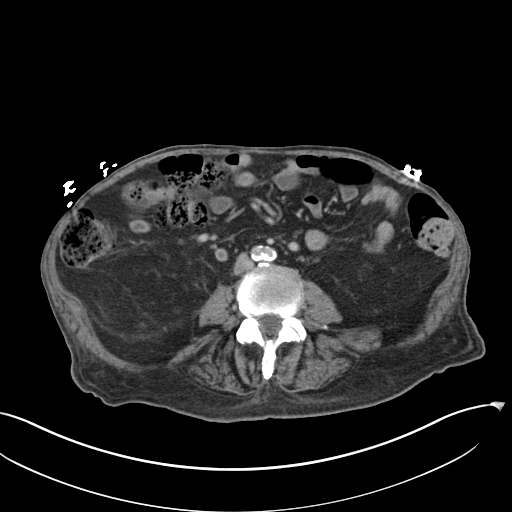
[im 53/99  soft-tissue]
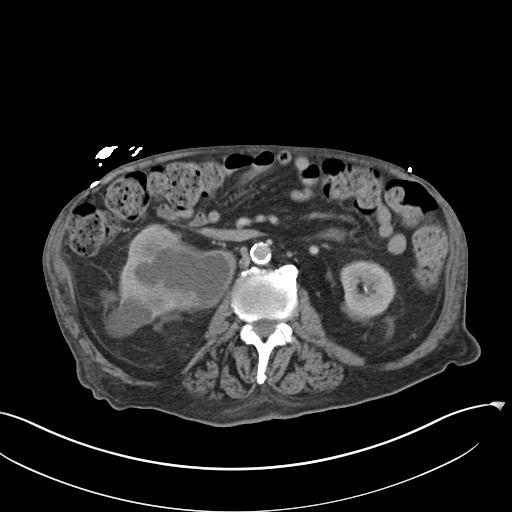
[im 59/99  soft-tissue]
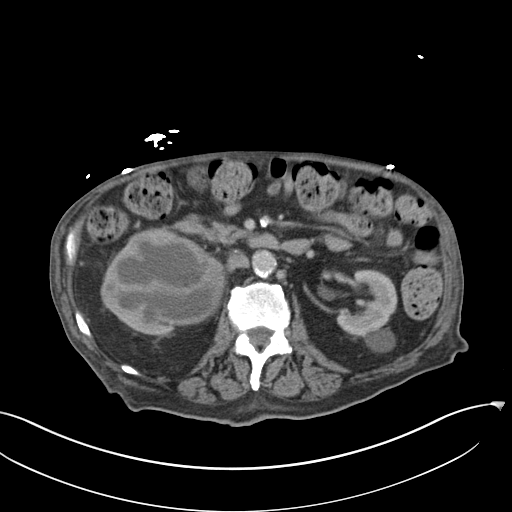
[im 66/99  soft-tissue]
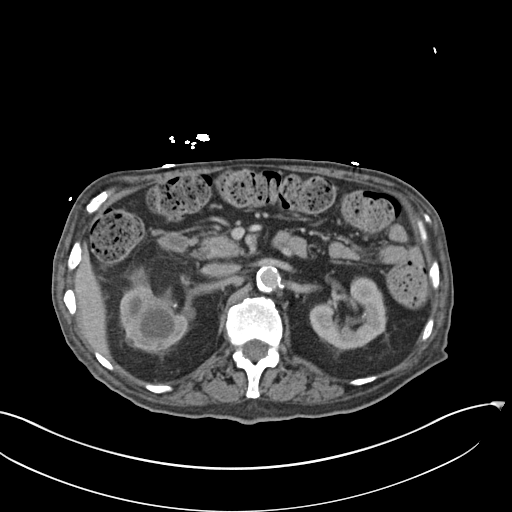
[im 66/99  bone]
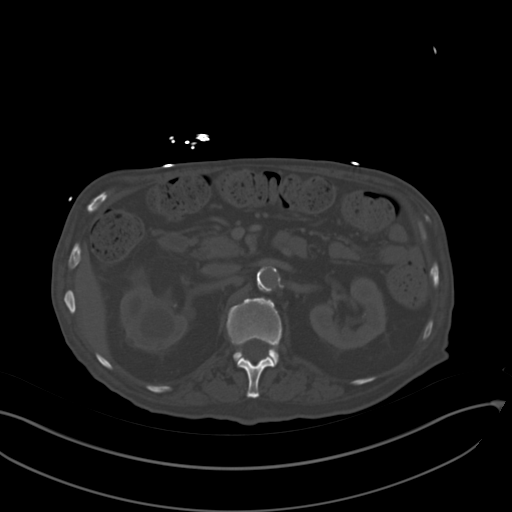
[im 79/99  soft-tissue]
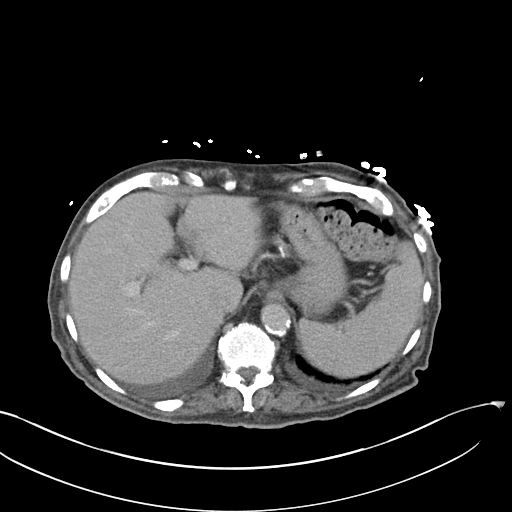
[im 85/99  soft-tissue]
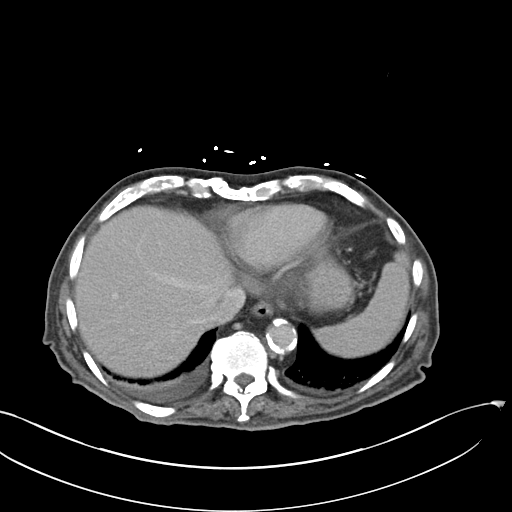
[im 92/99  soft-tissue]
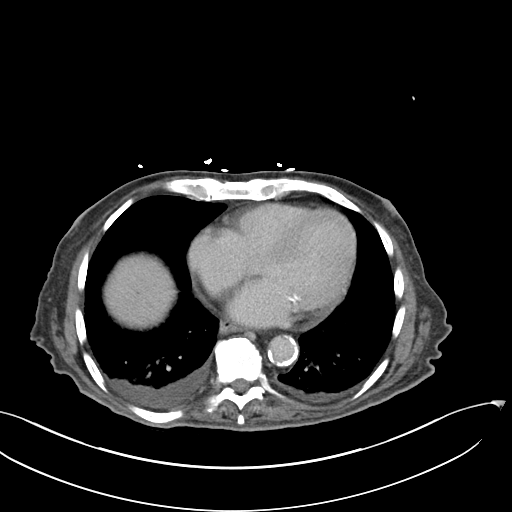

[Series 5: coronal a/|p · coronal · 0.77mm/px · 3 of 116 slices shown]
[im 39/116  soft-tissue]
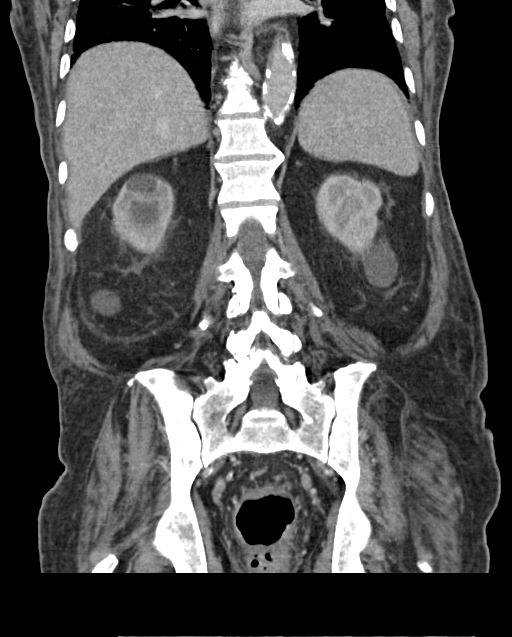
[im 52/116  soft-tissue]
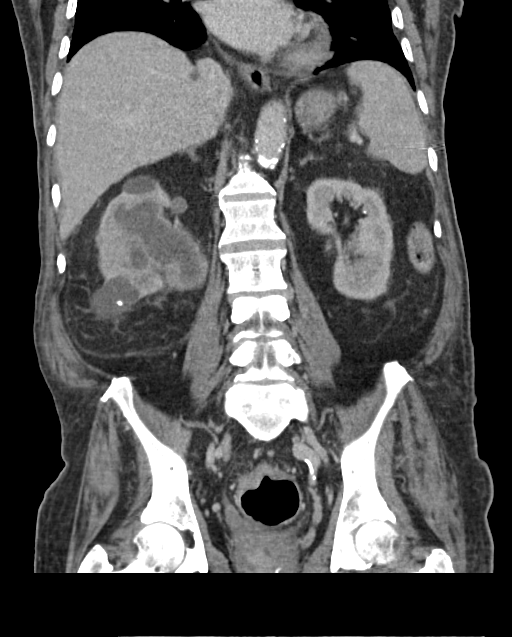
[im 64/116  soft-tissue]
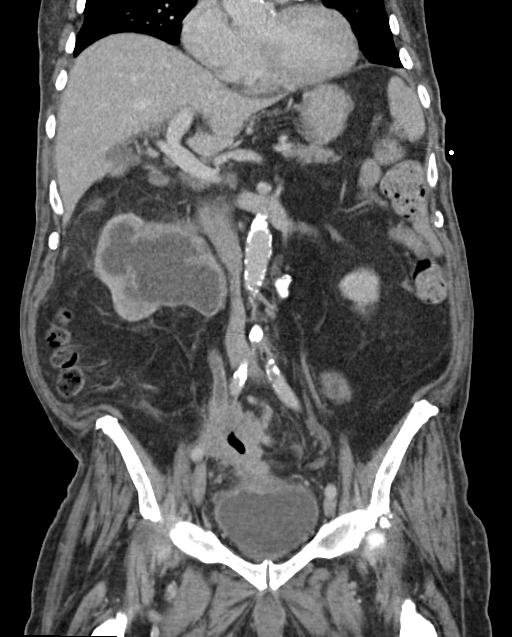

[15 of 46 positions shown; findings below may reference images not displayed]

FINDINGS: Lower chest: Small bilateral pleural effusions are noted right
greater than left. Dependent atelectatic changes are seen. No focal
infiltrate or sizable effusion is noted. Coronary calcifications are
noted.

Hepatobiliary: No focal liver abnormality is seen. No gallstones,
gallbladder wall thickening, or biliary dilatation.

Pancreas: Unremarkable. No pancreatic ductal dilatation or
surrounding inflammatory changes.

Spleen: Normal in size without focal abnormality.

Adrenals/Urinary Tract: The adrenal glands are within normal limits.
The left kidney demonstrates a few small nonobstructing calculi. The
largest of these lies in the lower pole measuring approximately 6
mm. An exophytic cyst is also seen. These changes are stable from
the prior exam.

The right kidney demonstrates significant obstructive change with
irregular enhancement of the collecting system and proximal ureter
suggesting a superimposed infection. The dilated right ureter
extends inferiorly and has some nodular appearing enhancement
identified best seen on image number 74 of series 2. Adjacent to
this area is the known locally invasive rectosigmoid carcinoma.
There is also apparent extension into the superior wall of the
urinary bladder on the right. Bladder is partially distended. The
invasive component measures at least 2 by 2.5 cm although actual
measurements are difficult.

Stomach/Bowel: The known locally in the bases of the rectosigmoid
carcinoma is again identified. The wall thickening has increased and
the distribution of the tumor in the colon has increased. There is
now significant extension into the bladder wall with a focal filling
defect within the bladder wall. There is also been apparent
extension into the distal right ureter increase in the degree of
obstruction. The appendix is well visualized and within normal
limits. There is a moderate fecal burden in the proximal colon
consistent with constipation related to the underlying narrowing in
the rectosigmoid.

Vascular/Lymphatic: Aortic atherosclerosis. No enlarged abdominal or
pelvic lymph nodes.

Reproductive: .Prostate is well visualize with central defect
consistent with prior surgery.

Other: No abdominal wall hernia or abnormality. No abdominopelvic
ascites.

Musculoskeletal: Degenerative changes of the lumbar spine are noted.
IMPRESSION: Progressive rectosigmoid carcinoma with invasion into the bladder
wall superiorly on the right as well as involvement of the distal
right ureter causing increased right-sided obstruction. Associated
constipation is noted as well.

Significant increase in the degree of obstruction in the right
kidney. Enhancement of the walls of the collecting system and
proximal ureters suggests superimposed infection.

Bilateral small pleural effusions.

Nonobstructing left renal stones.
